# Patient Record
Sex: Female | Born: 1980 | Hispanic: No | Marital: Single | Smoking: Former smoker
Health system: Southern US, Community
[De-identification: ages and names within clinical notes are randomized; demographics above are authoritative.]

## PROBLEM LIST (undated history)

## (undated) ENCOUNTER — Inpatient Hospital Stay (HOSPITAL_COMMUNITY): Payer: Medicaid Other

## (undated) ENCOUNTER — Ambulatory Visit (HOSPITAL_COMMUNITY): Discharge status: Absent without leave | Payer: Medicaid Other

## (undated) DIAGNOSIS — G8929 Other chronic pain: Secondary | ICD-10-CM

## (undated) DIAGNOSIS — M542 Cervicalgia: Secondary | ICD-10-CM

## (undated) DIAGNOSIS — F419 Anxiety disorder, unspecified: Secondary | ICD-10-CM

## (undated) DIAGNOSIS — M5020 Other cervical disc displacement, unspecified cervical region: Secondary | ICD-10-CM

## (undated) DIAGNOSIS — E785 Hyperlipidemia, unspecified: Secondary | ICD-10-CM

## (undated) HISTORY — PX: CERVICAL DISC SURGERY: SHX588

## (undated) HISTORY — PX: NOSE SURGERY: SHX723

---

## 2015-01-02 ENCOUNTER — Encounter (HOSPITAL_COMMUNITY): Payer: Self-pay | Admitting: *Deleted

## 2015-01-02 ENCOUNTER — Emergency Department (HOSPITAL_COMMUNITY)
Admission: EM | Admit: 2015-01-02 | Discharge: 2015-01-03 | Disposition: A | Discharge status: Home / Self Care | Payer: BLUE CROSS/BLUE SHIELD | Attending: Emergency Medicine | Admitting: Emergency Medicine

## 2015-01-02 DIAGNOSIS — H9202 Otalgia, left ear: Secondary | ICD-10-CM | POA: Diagnosis not present

## 2015-01-02 DIAGNOSIS — Z8639 Personal history of other endocrine, nutritional and metabolic disease: Secondary | ICD-10-CM | POA: Diagnosis not present

## 2015-01-02 DIAGNOSIS — G510 Bell's palsy: Secondary | ICD-10-CM | POA: Insufficient documentation

## 2015-01-02 DIAGNOSIS — Z720 Tobacco use: Secondary | ICD-10-CM | POA: Insufficient documentation

## 2015-01-02 DIAGNOSIS — R51 Headache: Secondary | ICD-10-CM | POA: Diagnosis present

## 2015-01-02 HISTORY — DX: Hyperlipidemia, unspecified: E78.5

## 2015-01-02 NOTE — ED Provider Notes (Signed)
CSN: [id number]     Arrival date & time 01/02/15  2313 History  By signing my name below, I, Doreatha Martin, attest that this documentation has been prepared under the direction and in the presence of Shon Baton, MD. Electronically Signed: Doreatha Martin, ED Scribe. 01/03/2015. 12:00 AM.      Chief Complaint  Patient presents with  . Headache   The history is provided by the patient. No language interpreter was used.    HPI Comments: Brandi Lopez is a 34 y.o. female who presents to the Emergency Department complaining of moderate, unchanged left sided facial numbness and weakness onset this morning with associated gradually worsening HA onset after the numbness, left-sided otalgia.  Headache is left-sided and 10 out of 10. She's not taking any of her headache. Denies maximal headache at onset. Denies any neck stiffness or fevers. No hx of HA. No Hx of similar symptoms. No recent illnesses. Pt is not concerned for pregnancy. She denies fever, vomiting, nausea, photophobia, focal weakness, visual disturbance.   Denies recent fevers.   Past Medical History  Diagnosis Date  . Hyperlipemia    History reviewed. No pertinent past surgical history. No family history on file. Social History  Substance Use Topics  . Smoking status: Current Every Day Smoker  . Smokeless tobacco: None  . Alcohol Use: No   OB History    No data available     Review of Systems  Constitutional: Negative for fever.  HENT: Positive for ear pain.   Eyes: Negative for photophobia and visual disturbance.  Cardiovascular: Negative for chest pain.  Gastrointestinal: Negative for nausea and vomiting.  Neurological: Positive for numbness and headaches. Negative for weakness.  All other systems reviewed and are negative.  Allergies  Review of patient's allergies indicates no known allergies.  Home Medications   Prior to Admission medications   Medication Sig Start Date End Date Taking? Authorizing Provider   cyclobenzaprine (FLEXERIL) 10 MG tablet Take 10 mg by mouth 3 (three) times daily as needed for muscle spasms.   Yes Historical Provider, MD  polyvinyl alcohol-povidone (REFRESH) 1.4-0.6 % ophthalmic solution Place 1-2 drops into both eyes as needed. 01/03/15   Shon Baton, MD  predniSONE (DELTASONE) 20 MG tablet Take 3 tablets (60 mg total) by mouth daily with breakfast. 01/03/15   Shon Baton, MD  valACYclovir (VALTREX) 1000 MG tablet Take 1 tablet (1,000 mg total) by mouth 3 (three) times daily. 01/03/15 01/17/15  Shon Baton, MD   BP 109/66 mmHg  Pulse 72  Temp(Src) 98.4 F (36.9 C) (Oral)  Resp 16  Ht  (1.626 m)  Wt 178 lb 8 oz (80.967 kg)  BMI 30.62 kg/m2  SpO2 99%  LMP 11/29/2014 Physical Exam  Constitutional: She is oriented to person, place, and time. She appears well-developed and well-nourished. No distress.  HENT:  Head: Normocephalic and atraumatic.  Mouth/Throat: Oropharynx is clear and moist.  Eyes: Pupils are equal, round, and reactive to light.  Cardiovascular: Normal rate, regular rhythm and normal heart sounds.   Pulmonary/Chest: Effort normal and breath sounds normal. No respiratory distress. She has no wheezes.  Abdominal: Soft. Bowel sounds are normal.  Neurological: She is alert and oriented to person, place, and time.  5 out of 5 strength in all 4 extremities, palsy of the left facial nerve noted with drooping of the entire left face involving the forehead, no other obvious neurologic deficits  Skin: Skin is warm and dry.  Psychiatric:  She has a normal mood and affect.  Nursing note and vitals reviewed.   ED Course  Procedures (including critical care time) DIAGNOSTIC STUDIES: Oxygen Saturation is 98% on RA, normal by my interpretation.    COORDINATION OF CARE: 11:59 PM Discussed treatment plan with pt at bedside and pt agreed to plan.   Labs Review Labs Reviewed - No data to display  Imaging Review No results found. I have  personally reviewed and evaluated these images and lab results as part of my medical decision-making.   EKG Interpretation None      MDM   Final diagnoses:  Bell's palsy    Patient presents with left-sided facial droop, ear pain, numbness, and headache. Symptoms are classic for Bell's palsy. No other neurologic deficits. Patient was given eyedrops, prednisone, and Toradol for her headache. On recheck, patient reports improvement of her headache. Discussed with patient supportive treatment with eyedrops as well as prednisone and valacyclovir for one week. Follow-up with primary physician in one week. Patient stated understanding.  After history, exam, and medical workup I feel the patient has been appropriately medically screened and is safe for discharge home. Pertinent diagnoses were discussed with the patient. Patient was given return precautions.  I personally performed the services described in this documentation, which was scribed in my presence. The recorded information has been reviewed and is accurate.   Shon Baton, MD 01/03/15 [phone]

## 2015-01-02 NOTE — ED Notes (Signed)
The pt woke up this am at 0700am with the lt side of her face numb and her lt eye would not close  Also the lt side of hermlouth her smile  Was not moving on that side.  Headache on the lt.  No previous history.Marland Kitchen lmp aug 20th

## 2015-01-03 ENCOUNTER — Encounter (HOSPITAL_COMMUNITY): Payer: Self-pay

## 2015-01-03 MED ORDER — PREDNISONE 20 MG PO TABS
60.0000 mg | ORAL_TABLET | Freq: Once | ORAL | Status: AC
  Administered 2015-01-03: 60 mg via ORAL
  Filled 2015-01-03: qty 3

## 2015-01-03 MED ORDER — VALACYCLOVIR HCL 1 G PO TABS: 1000.0000 mg | ORAL_TABLET | Freq: Three times a day (TID) | ORAL | Status: AC

## 2015-01-03 MED ORDER — PREDNISONE 20 MG PO TABS: 60.0000 mg | ORAL_TABLET | Freq: Every day | ORAL | Status: DC

## 2015-01-03 MED ORDER — KETOTIFEN FUMARATE 0.025 % OP SOLN
1.0000 [drp] | Freq: Two times a day (BID) | OPHTHALMIC | Status: DC
  Administered 2015-01-03: 1 [drp] via OPHTHALMIC
  Filled 2015-01-03: qty 5

## 2015-01-03 MED ORDER — KETOROLAC TROMETHAMINE 30 MG/ML IJ SOLN
30.0000 mg | Freq: Once | INTRAMUSCULAR | Status: AC
  Administered 2015-01-03: 30 mg via INTRAVENOUS
  Filled 2015-01-03: qty 1

## 2015-01-03 MED ORDER — POLYVINYL ALCOHOL-POVIDONE 1.4-0.6 % OP SOLN: 1.0000 [drp] | OPHTHALMIC | Status: DC | PRN

## 2015-01-03 NOTE — Discharge Instructions (Signed)
Bell's Palsy °Bell's palsy is a condition in which the muscles on one side of the face cannot move (paralysis). This is because the nerves in the face are paralyzed. It is most often thought to be caused by a virus. The virus causes swelling of the nerve that controls movement on one side of the face. The nerve travels through a tight space surrounded by bone. When the nerve swells, it can be compressed by the bone. This results in damage to the protective covering around the nerve. This damage interferes with how the nerve communicates with the muscles of the face. As a result, it can cause weakness or paralysis of the facial muscles.  °Injury (trauma), tumor, and surgery may cause Bell's palsy, but most of the time the cause is unknown. It is a relatively common condition. It starts suddenly (abrupt onset) with the paralysis usually ending within 2 days. Bell's palsy is not dangerous. But because the eye does not close properly, you may need care to keep the eye from getting dry. This can include splinting (to keep the eye shut) or moistening with artificial tears. Bell's palsy very seldom occurs on both sides of the face at the same time. °SYMPTOMS  °· Eyebrow sagging. °· Drooping of the eyelid and corner of the mouth. °· Inability to close one eye. °· Loss of taste on the front of the tongue. °· Sensitivity to loud noises. °TREATMENT  °The treatment is usually non-surgical. If the patient is seen within the first 24 to 48 hours, a short course of steroids may be prescribed, in an attempt to shorten the length of the condition. Antiviral medicines may also be used with the steroids, but it is unclear if they are helpful.  °You will need to protect your eye, if you cannot close it. The cornea (clear covering over your eye) will become dry and can be damaged. Artificial tears can be used to keep your eye moist. Glasses or an eye patch should be worn to protect your eye. °PROGNOSIS  °Recovery is variable, ranging  from days to months. Although the problem usually goes away completely (about 80% of cases resolve), predicting the outcome is impossible. Most people improve within 3 weeks of when the symptoms began. Improvement may continue for 3 to 6 months. A small number of people have moderate to severe weakness that is permanent.  °HOME CARE INSTRUCTIONS  °· If your caregiver prescribed medication to reduce swelling in the nerve, use as directed. Do not stop taking the medication unless directed by your caregiver. °· Use moisturizing eye drops as needed to prevent drying of your eye, as directed by your caregiver. °· Protect your eye, as directed by your caregiver. °· Use facial massage and exercises, as directed by your caregiver. °· Perform your normal activities, and get your normal rest. °SEEK IMMEDIATE MEDICAL CARE IF:  °· There is pain, redness or irritation in the eye. °· You or your child has an oral temperature above 102° F (38.9° C), not controlled by medicine. °MAKE SURE YOU:  °· Understand these instructions. °· Will watch your condition. °· Will get help right away if you are not doing well or get worse. °Document Released: 03/23/2005 Document Revised: 06/15/2011 Document Reviewed: 06/30/2013 °[employer]® Patient Information ©2015 [employer]. This information is not intended to replace advice given to you by your health care provider. Make sure you discuss any questions you have with your health care provider. ° °

## 2016-05-20 ENCOUNTER — Emergency Department (HOSPITAL_BASED_OUTPATIENT_CLINIC_OR_DEPARTMENT_OTHER)
Admission: EM | Admit: 2016-05-20 | Discharge: 2016-05-20 | Disposition: A | Discharge status: Home / Self Care | Payer: Medicaid Other | Attending: Emergency Medicine | Admitting: Emergency Medicine

## 2016-05-20 ENCOUNTER — Encounter (HOSPITAL_BASED_OUTPATIENT_CLINIC_OR_DEPARTMENT_OTHER): Payer: Self-pay

## 2016-05-20 DIAGNOSIS — F17200 Nicotine dependence, unspecified, uncomplicated: Secondary | ICD-10-CM | POA: Diagnosis not present

## 2016-05-20 DIAGNOSIS — Z79899 Other long term (current) drug therapy: Secondary | ICD-10-CM | POA: Insufficient documentation

## 2016-05-20 DIAGNOSIS — M542 Cervicalgia: Secondary | ICD-10-CM | POA: Diagnosis present

## 2016-05-20 DIAGNOSIS — Z8739 Personal history of other diseases of the musculoskeletal system and connective tissue: Secondary | ICD-10-CM | POA: Diagnosis not present

## 2016-05-20 HISTORY — DX: Other cervical disc displacement, unspecified cervical region: M50.20

## 2016-05-20 HISTORY — DX: Cervicalgia: M54.2

## 2016-05-20 HISTORY — DX: Other chronic pain: G89.29

## 2016-05-20 MED ORDER — GABAPENTIN 100 MG PO CAPS
100.0000 mg | ORAL_CAPSULE | Freq: Once | ORAL | Status: AC
  Administered 2016-05-20: 100 mg via ORAL
  Filled 2016-05-20: qty 1

## 2016-05-20 MED ORDER — GABAPENTIN 100 MG PO CAPS: 100.0000 mg | ORAL_CAPSULE | Freq: Three times a day (TID) | ORAL | 2 refills | Status: DC

## 2016-05-20 MED ORDER — CARISOPRODOL 250 MG PO TABS: 250.0000 mg | ORAL_TABLET | Freq: Three times a day (TID) | ORAL | 0 refills | Status: DC

## 2016-05-20 MED ORDER — PREDNISONE 10 MG (21) PO TBPK: ORAL_TABLET | ORAL | 0 refills | Status: DC

## 2016-05-20 MED ORDER — CARISOPRODOL 350 MG PO TABS
350.0000 mg | ORAL_TABLET | Freq: Once | ORAL | Status: DC
  Filled 2016-05-20: qty 1

## 2016-05-20 MED ORDER — PREDNISONE 50 MG PO TABS
60.0000 mg | ORAL_TABLET | Freq: Once | ORAL | Status: AC
  Administered 2016-05-20: 60 mg via ORAL
  Filled 2016-05-20: qty 1

## 2016-05-20 MED FILL — tiZANidine HCL 4 MG TABS: 4 | 14 days supply | Qty: 28 | Fill #0

## 2016-05-20 MED FILL — predniSONE 10 MG TABS: 10 | 12 days supply | Qty: 42 | Fill #0

## 2016-05-20 MED FILL — GABAPENTIN 100 MG CAPSULE: 100 | 30 days supply | Qty: 90 | Fill #0

## 2016-05-20 NOTE — ED Notes (Signed)
Pt states she is out of lyrica

## 2016-05-20 NOTE — ED Provider Notes (Signed)
[facility]-EMERGENCY DEPT Provider Note   CSN: [id number] Arrival date & time: 05/20/16  1349     History   Chief Complaint Chief Complaint  Patient presents with  . Neck Pain    HPI Brandi Lopez is a 36 y.o. female.  HPI   Brandi Lopez is a 36 y.o. female, with a history of herniated cervical disks, presenting to the ED with Acute on chronic neck and right arm pain. Patient states that she has multiple known herniated disks in her cervical spine. She has been able to maintain with her daily prescribed exercises and other nonmedication treatment regimens, however, about a week ago her pain began to become intolerable again. She endorses pain in the right side of her neck that radiates down the right arm into her right hand accompanied by paresthesias. She states that she recently moved here from [place] and has not yet secured a neurologist in the area. Prior to leaving [place], she was changed from Neurontin to Lyrica when she changed neurologists. She states that she believes she will need to change back to Neurontin as the Lyrica is not as effective. Patient denies falls/trauma, unilateral weakness, numbness, fever, or any other complaints.     Past Medical History:  Diagnosis Date  . Herniated cervical disc   . Hyperlipemia   . Neck pain, chronic     There are no active problems to display for this patient.   Past Surgical History:  Procedure Laterality Date  . CERVICAL DISC SURGERY      OB History    No data available       Home Medications    Prior to Admission medications   Medication Sig Start Date End Date Taking? Authorizing Provider  Pregabalin (LYRICA PO) Take by mouth.   Yes Historical Provider, MD  TIZANIDINE HCL PO Take by mouth.   Yes Historical Provider, MD  TRAMADOL HCL ER PO Take by mouth.   Yes Historical Provider, MD  gabapentin (NEURONTIN) 100 MG capsule Take 1 capsule (100 mg total) by mouth 3 (three) times daily. 05/20/16 08/18/16  Nusrat Encarnacion C Nandan Willems, PA-C    polyvinyl alcohol-povidone (REFRESH) 1.4-0.6 % ophthalmic solution Place 1-2 drops into both eyes as needed. 01/03/15   Shon Baton, MD  predniSONE (STERAPRED UNI-PAK 21 TAB) 10 MG (21) TBPK tablet Take 6 tabs by mouth daily  for 2 days, then 5 tabs for 2 days, then 4 tabs for 2 days, then 3 tabs for 2 days, 2 tabs for 2 days, then 1 tab by mouth daily for 2 days 05/20/16   Anselm Pancoast, PA-C    Family History No family history on file.  Social History Social History  Substance Use Topics  . Smoking status: Current Every Day Smoker  . Smokeless tobacco: Never Used  . Alcohol use No     Allergies   Patient has no known allergies.   Review of Systems Review of Systems  Constitutional: Negative for fever.  Musculoskeletal: Positive for neck pain.  Neurological: Negative for dizziness, syncope, weakness, light-headedness, numbness and headaches.     Physical Exam Updated Vital Signs BP 129/80   Pulse 87   Temp 98.1 F (36.7 C)   Resp 16   LMP 05/10/2016   SpO2 100%   Physical Exam  Constitutional: She appears well-developed and well-nourished. No distress.  HENT:  Head: Normocephalic and atraumatic.  Eyes: Conjunctivae and EOM are normal. Pupils are equal, round, and reactive to light.  Neck: Normal range  of motion. Neck supple.  Cardiovascular: Normal rate, regular rhythm and intact distal pulses.   Pulmonary/Chest: Effort normal.  Musculoskeletal: She exhibits no edema.  Tenderness to the posterior cervical region extending into the right trapezius. Normal motor function intact in all extremities and spine. No midline spinal tenderness.   Lymphadenopathy:    She has no cervical adenopathy.  Neurological: She is alert.  No sensory deficits. Strength 5/5 in all extremities. No gait disturbance. Coordination intact including heel to shin and finger to nose. Cranial nerves III-XII grossly intact. No facial droop.   Skin: Skin is warm and dry. Capillary refill takes  less than 2 seconds. She is not diaphoretic.  Psychiatric: She has a normal mood and affect. Her behavior is normal.  Nursing note and vitals reviewed.    ED Treatments / Results  Labs (all labs ordered are listed, but only abnormal results are displayed) Labs Reviewed - No data to display  EKG  EKG Interpretation None       Radiology No results found.  Procedures Procedures (including critical care time)  Medications Ordered in ED Medications  gabapentin (NEURONTIN) capsule 100 mg (100 mg Oral Given 05/20/16 1520)  predniSONE (DELTASONE) tablet 60 mg (60 mg Oral Given 05/20/16 1520)     Initial Impression / Assessment and Plan / ED Course  I have reviewed the triage vital signs and the nursing notes.  Pertinent labs & imaging results that were available during my care of the patient were reviewed by me and considered in my medical decision making (see chart for details).     Patient presents with acute on chronic neck pain. Shared decision making was used to determine the best medication regimen for her at this time. She was encouraged to follow-up with a neurologist and to establish care with a PCP. Resources were discussed. Return precautions were also discussed. Patient voices understanding and is comfortable with discharge.    The pharmacy called me and told me that Tresa GarterSoma is not covered without prior authorization under Medicaid. Tresa GarterSoma was canceled with the pharmacy and replaced with tizanidine 4 mg twice a day for 2 weeks. Patient has the option to split these in half. This was verified with the patient as an acceptable alternative.    Vitals:   05/20/16 1359 05/20/16 1543  BP: 129/80 125/91  Pulse: 87 84  Resp: 16 16  Temp: 98.1 F (36.7 C)   SpO2: 100% 100%      Final Clinical Impressions(s) / ED Diagnoses   Final diagnoses:  Neck pain    New Prescriptions Discharge Medication List as of 05/20/2016  3:38 PM    START taking these medications    Details  gabapentin (NEURONTIN) 100 MG capsule Take 1 capsule (100 mg total) by mouth 3 (three) times daily., Starting Wed 05/20/2016, Until Tue 08/18/2016, Print    predniSONE (STERAPRED UNI-PAK 21 TAB) 10 MG (21) TBPK tablet Take 6 tabs by mouth daily  for 2 days, then 5 tabs for 2 days, then 4 tabs for 2 days, then 3 tabs for 2 days, 2 tabs for 2 days, then 1 tab by mouth daily for 2 days, Print    carisoprodol (SOMA) 250 MG tablet Take 1 tablet (250 mg total) by mouth 3 (three) times daily., Starting Wed 05/20/2016, Until Wed 06/03/2016, Print         Anselm PancoastShawn C Maude Hettich, PA-C 05/21/16 [id number]    Loren Raceravid Yelverton, MD 05/21/16 [phone]

## 2016-05-20 NOTE — Discharge Instructions (Signed)
You have been seen today for neck pain that you are familiar with. Please establish care with a neurologist in the area as soon as possible.  Please refer to the dedicated section in this discharge summary for a phone number and website that can give you a starting point in finding a primary care provider. You may also try contacting [facility] [phone]

## 2016-05-20 NOTE — ED Triage Notes (Signed)
Pt reports hx of "hernaited disk" to neck-neck pain x 1 week-denies injury-NAD-steady gait

## 2016-05-28 ENCOUNTER — Emergency Department (HOSPITAL_BASED_OUTPATIENT_CLINIC_OR_DEPARTMENT_OTHER)
Admission: EM | Admit: 2016-05-28 | Discharge: 2016-05-28 | Disposition: A | Discharge status: Home / Self Care | Payer: Medicaid Other | Attending: Emergency Medicine | Admitting: Emergency Medicine

## 2016-05-28 ENCOUNTER — Emergency Department (HOSPITAL_BASED_OUTPATIENT_CLINIC_OR_DEPARTMENT_OTHER): Payer: Medicaid Other

## 2016-05-28 ENCOUNTER — Encounter (HOSPITAL_BASED_OUTPATIENT_CLINIC_OR_DEPARTMENT_OTHER): Payer: Self-pay | Admitting: *Deleted

## 2016-05-28 DIAGNOSIS — Z79899 Other long term (current) drug therapy: Secondary | ICD-10-CM | POA: Insufficient documentation

## 2016-05-28 DIAGNOSIS — R200 Anesthesia of skin: Secondary | ICD-10-CM | POA: Diagnosis not present

## 2016-05-28 DIAGNOSIS — F17200 Nicotine dependence, unspecified, uncomplicated: Secondary | ICD-10-CM | POA: Insufficient documentation

## 2016-05-28 DIAGNOSIS — M542 Cervicalgia: Secondary | ICD-10-CM | POA: Diagnosis present

## 2016-05-28 MED ORDER — MELOXICAM 15 MG PO TABS: 15.0000 mg | ORAL_TABLET | Freq: Every day | ORAL | 0 refills | Status: DC

## 2016-05-28 MED ORDER — OXYCODONE-ACETAMINOPHEN 5-325 MG PO TABS: 1.0000 | ORAL_TABLET | ORAL | 0 refills | Status: DC | PRN

## 2016-05-28 MED FILL — OXYCODONE/APAP 5/325 MG TAB: 5-325 | 2 days supply | Qty: 20 | Fill #0

## 2016-05-28 MED FILL — MELOXICAM 15 MG TABLET: 15 | 30 days supply | Qty: 30 | Fill #0

## 2016-05-28 NOTE — Discharge Instructions (Signed)
Your CT scan was negative. Your bells palsy may be recurring. Please follow up with your MRI today and Dr. Ellan Lambertnasanya. Return for any new or abnormal neurological symptoms.

## 2016-05-28 NOTE — ED Triage Notes (Signed)
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 She saw a neurologist yesterday. He scheduled an MRI for today. States she is having worsened coldness in her right arm and twitching and numbness in her face for a few days. The neurologist told her until he saw the results of the MRI there was not much he could do.

## 2016-05-28 NOTE — ED Provider Notes (Signed)
[facility]   CSN: [id number] Arrival date & time: 05/28/16  1045     History   Chief Complaint Chief Complaint  Patient presents with  . Neck Pain    HPI Brandi Lopez is a 36 y.o. 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 Previous self limiting episodes. This episode is worst and longest seen  On 05/20/2016 in the ed and given prednisone, Neurontin, zanaflex. She felt that the Neurontin made her really sleepy and weak. She has not been taking it as prescribed. She saw Dr. Ellan Lambert at [facility] neurology who has her scheudled for an MRI Today at 4:30. 2 days ago dhe began having R facial numbness. Arm is very weak, affecting her ability to type at work. Arm feels "like its freezing cold and my muscle feel so weak I can barely move it."  Family Hx of Machado-Joseph disease (an inherited neurologic ataxia disorder).  She has a history of previous right-sided Bell's palsy  HPI  Past Medical History:  Diagnosis Date  . Herniated cervical disc   . Hyperlipemia   . Neck pain, chronic     There are no active problems to display for this patient.   Past Surgical History:  Procedure Laterality Date  . CERVICAL DISC SURGERY      OB History    No data available       Home Medications    Prior to Admission medications   Medication Sig Start Date End Date Taking? Authorizing Provider  gabapentin (NEURONTIN) 100 MG capsule Take 1 capsule (100 mg total) by mouth 3 (three) times daily. 05/20/16 08/18/16  Shawn C Joy, PA-C  meloxicam (MOBIC) 15 MG tablet Take 1 tablet (15 mg total) by mouth daily. Take 1 daily with food. 05/28/16   Arthor Captain, PA-C  oxyCODONE-acetaminophen (PERCOCET) 5-325 MG tablet Take 1-2 tablets by mouth every 4 (four) hours as needed. 05/28/16   Arthor Captain, PA-C  polyvinyl alcohol-povidone (REFRESH) 1.4-0.6 % ophthalmic solution Place 1-2 drops into both eyes as needed. 01/03/15   Shon Baton, MD    predniSONE (STERAPRED UNI-PAK 21 TAB) 10 MG (21) TBPK tablet Take 6 tabs by mouth daily  for 2 days, then 5 tabs for 2 days, then 4 tabs for 2 days, then 3 tabs for 2 days, 2 tabs for 2 days, then 1 tab by mouth daily for 2 days 05/20/16   Shawn C Joy, PA-C  Pregabalin (LYRICA PO) Take by mouth.    Historical Provider, MD  TIZANIDINE HCL PO Take by mouth.    Historical Provider, MD  TRAMADOL HCL ER PO Take by mouth.    Historical Provider, MD    Family History No family history on file.  Social History Social History  Substance Use Topics  . Smoking status: Current Every Day Smoker  . Smokeless tobacco: Never Used  . Alcohol use No     Allergies   Patient has no known allergies.   Review of Systems Review of Systems  Ten systems reviewed and are negative for acute change, except as noted in the HPI.   Physical Exam Updated Vital Signs BP 130/73 (BP Location: Right Arm)   Pulse 74   Temp 98.3 F (36.8 C) (Oral)   Resp 16   Ht 5\' 4"  (1.626 m)   Wt 78 kg   LMP 05/10/2016   SpO2 100%   BMI 29.52 kg/m   Physical Exam  Constitutional: She is oriented  to person, place, and time. She appears well-developed and well-nourished. No distress.  HENT:  Head: Normocephalic and atraumatic.  Eyes: Conjunctivae and EOM are normal. Pupils are equal, round, and reactive to light. No scleral icterus.  Neck: Normal range of motion.  Cardiovascular: Normal rate, regular rhythm and normal heart sounds.  Exam reveals no gallop and no friction rub.   No murmur heard. Pulmonary/Chest: Effort normal and breath sounds normal. No respiratory distress.  Abdominal: Soft. Bowel sounds are normal. She exhibits no distension and no mass. There is no tenderness. There is no guarding.  Neurological: She is alert and oriented to person, place, and time.  Speech is clear and goal oriented, follows commands Major Cranial nerves without deficit EXCEPT SUBJECTIVE FACIAL NUMBNESS ON THE RIGHT, no facial  droop Normal strength in upper and lower extremities bilaterally including dorsiflexion and plantar flexion, strong and equal grip strength Sensation normal to light and sharp touch Moves extremities without ataxia, coordination intact Normal finger to nose and rapid alternating movements Neg romberg, no pronator drift Normal gait Normal heel-shin and balance   Skin: Skin is warm and dry. She is not diaphoretic.     ED Treatments / Results  Labs (all labs ordered are listed, but only abnormal results are displayed) Labs Reviewed - No data to display  EKG  EKG Interpretation None       Radiology Ct Head Wo Contrast  Result Date: 05/28/2016 CLINICAL DATA:  Right facial numbness EXAM: CT HEAD WITHOUT CONTRAST TECHNIQUE: Contiguous axial images were obtained from the base of the skull through the vertex without intravenous contrast. COMPARISON:  None. FINDINGS: Brain: No evidence of acute infarction, hemorrhage, hydrocephalus, extra-axial collection or mass lesion/mass effect. Vascular: No hyperdense vessel or unexpected calcification. Skull: Negative Sinuses/Orbits: Negative Other: None IMPRESSION: Negative CT head Electronically Signed   By: Marlan Palau M.D.   On: 05/28/2016 13:28    Procedures Procedures (including critical care time)  Medications Ordered in ED Medications - No data to display   Initial Impression / Assessment and Plan / ED Course  I have reviewed the triage vital signs and the nursing notes.  Pertinent labs & imaging results that were available during my care of the patient were reviewed by me and considered in my medical decision making (see chart for details).  Clinical Course as of May 28 1653  Thu May 28, 2016  1232 REVIEWED nccsrs- NO MEDS   [AH]    Clinical Course User Index [AH] Arthor Captain, PA-C    Patient with negative CT of the head. I suspect that her facial sensation is likely secondary to potential recurring Bell's palsy or due to  her shoulder pain and referred to the face. I did discuss this with Dr. Anitra Lauth. She does not feel that we need to work. The patient not for stroke at this time. Patient states that she also discussed this with her neurologist yesterday and he did not order or recommend an MRI of the brain at that time. She has a pending MRI of the neck this afternoon and I have recommended that she make sure she goes. I have also given the patient's anti-inflammatory and pain medications for treatment of her severe symptom. He is safe for discharge at this time Final Clinical Impressions(s) / ED Diagnoses   Final diagnoses:  Neck pain    New Prescriptions Discharge Medication List as of 05/28/2016  1:59 PM    START taking these medications   Details  meloxicam (  MOBIC) 15 MG tablet Take 1 tablet (15 mg total) by mouth daily. Take 1 daily with food., Starting Thu 05/28/2016, Print    oxyCODONE-acetaminophen (PERCOCET) 5-325 MG tablet Take 1-2 tablets by mouth every 4 (four) hours as needed., Starting Thu 05/28/2016, Print         Arthor CaptainAbigail Cydnie Deason, PA-C 05/28/16 1655    Gwyneth SproutWhitney Plunkett, MD 05/29/16 [phone]

## 2016-05-28 NOTE — ED Notes (Signed)
Patient transported to CT 

## 2016-06-15 MED FILL — GABAPENTIN 100 MG CAP: 100 | 30 days supply | Qty: 90 | Fill #1

## 2016-07-01 ENCOUNTER — Other Ambulatory Visit (HOSPITAL_COMMUNITY)
Admission: RE | Admit: 2016-07-01 | Discharge: 2016-07-01 | Disposition: A | Discharge status: Home / Self Care | Payer: Medicaid Other | Source: Ambulatory Visit | Attending: Obstetrics | Admitting: Obstetrics

## 2016-07-01 ENCOUNTER — Ambulatory Visit (INDEPENDENT_AMBULATORY_CARE_PROVIDER_SITE_OTHER): Payer: Medicaid Other | Admitting: Obstetrics

## 2016-07-01 ENCOUNTER — Encounter: Payer: Self-pay | Admitting: Obstetrics

## 2016-07-01 VITALS — BP 127/86 | HR 86 | Ht 64.0 in | Wt 170.0 lb

## 2016-07-01 DIAGNOSIS — Z113 Encounter for screening for infections with a predominantly sexual mode of transmission: Secondary | ICD-10-CM

## 2016-07-01 DIAGNOSIS — N898 Other specified noninflammatory disorders of vagina: Secondary | ICD-10-CM

## 2016-07-01 DIAGNOSIS — Z01419 Encounter for gynecological examination (general) (routine) without abnormal findings: Secondary | ICD-10-CM

## 2016-07-01 DIAGNOSIS — Z20828 Contact with and (suspected) exposure to other viral communicable diseases: Secondary | ICD-10-CM

## 2016-07-01 NOTE — Progress Notes (Signed)
Subjective:        Brandi Lopez is a 36 y.o. female here for a routine exam.  Current complaints: Concerned that partner has HSV-1.    Personal health questionnaire:  Is patient Brandi Lopez, have a family history of breast and/or ovarian cancer: no Is there a family history of uterine cancer diagnosed at age < 44, gastrointestinal cancer, urinary tract cancer, family member who is a Personnel officer syndrome-associated carrier: no Is the patient overweight and hypertensive, family history of diabetes, personal history of gestational diabetes, preeclampsia or PCOS: no Is patient over 28, have PCOS,  family history of premature CHD under age 23, diabetes, smoke, have hypertension or peripheral artery disease:  no At any time, has a partner hit, kicked or otherwise hurt or frightened you?: no Over the past 2 weeks, have you felt down, depressed or hopeless?: no Over the past 2 weeks, have you felt little interest or pleasure in doing things?:no   Gynecologic History Patient's last menstrual period was 06/16/2016. Contraception: none Last Pap: 2016. Results were: normal Last mammogram: few years ago. Results were: normal  Obstetric History OB History  Gravida Para Term Preterm AB Living  2       1 1   SAB TAB Ectopic Multiple Live Births  1            # Outcome Date GA Lbr Len/2nd Weight Sex Delivery Anes PTL Lv  2 Gravida           1 SAB               Past Medical History:  Diagnosis Date  . Herniated cervical disc   . Hyperlipemia   . Neck pain, chronic     Past Surgical History:  Procedure Laterality Date  . CERVICAL DISC SURGERY       Current Outpatient Prescriptions:  .  gabapentin (NEURONTIN) 100 MG capsule, Take 1 capsule (100 mg total) by mouth 3 (three) times daily., Disp: 90 capsule, Rfl: 2 .  meloxicam (MOBIC) 15 MG tablet, Take 1 tablet (15 mg total) by mouth daily. Take 1 daily with food., Disp: 30 tablet, Rfl: 0 .  oxyCODONE-acetaminophen (PERCOCET) 5-325 MG  tablet, Take 1-2 tablets by mouth every 4 (four) hours as needed., Disp: 20 tablet, Rfl: 0 .  TIZANIDINE HCL PO, Take by mouth., Disp: , Rfl:  .  polyvinyl alcohol-povidone (REFRESH) 1.4-0.6 % ophthalmic solution, Place 1-2 drops into both eyes as needed. (Patient not taking: Reported on 07/01/2016), Disp: 30 mL, Rfl: 0 .  predniSONE (STERAPRED UNI-PAK 21 TAB) 10 MG (21) TBPK tablet, Take 6 tabs by mouth daily  for 2 days, then 5 tabs for 2 days, then 4 tabs for 2 days, then 3 tabs for 2 days, 2 tabs for 2 days, then 1 tab by mouth daily for 2 days (Patient not taking: Reported on 07/01/2016), Disp: 42 tablet, Rfl: 0 .  Pregabalin (LYRICA PO), Take by mouth., Disp: , Rfl:  .  TRAMADOL HCL ER PO, Take by mouth., Disp: , Rfl:  No Known Allergies  Social History  Substance Use Topics  . Smoking status: Current Every Day Smoker    Types: Cigarettes  . Smokeless tobacco: Never Used  . Alcohol use No    Family History  Problem Relation Age of Onset  . Diabetes Mother   . Hypertension Mother   . Diabetes Father   . Hypertension Father       Review of Systems  Constitutional: negative  for fatigue and weight loss Respiratory: negative for cough and wheezing Cardiovascular: negative for chest pain, fatigue and palpitations Gastrointestinal: negative for abdominal pain and change in bowel habits Musculoskeletal:negative for myalgias Neurological: negative for gait problems and tremors Behavioral/Psych: negative for abusive relationship, depression Endocrine: negative for temperature intolerance    Genitourinary:negative for abnormal menstrual periods, genital lesions, hot flashes, sexual problems and vaginal discharge Integument/breast: negative for breast lump, breast tenderness, nipple discharge and skin lesion(s)    Objective:       BP 127/86   Pulse 86   Ht 5\' 4"  (1.626 m)   Wt 170 lb (77.1 kg)   LMP 06/16/2016   BMI 29.18 kg/m  General:   alert  Skin:   no rash or  abnormalities  Lungs:   clear to auscultation bilaterally  Heart:   regular rate and rhythm, S1, S2 normal, no murmur, click, rub or gallop  Breasts:   normal without suspicious masses, skin or nipple changes or axillary nodes  Abdomen:  normal findings: no organomegaly, soft, non-tender and no hernia  Pelvis:  External genitalia: normal general appearance Urinary system: urethral meatus normal and bladder without fullness, nontender Vaginal: normal without tenderness, induration or masses Cervix: normal appearance Adnexa: normal bimanual exam Uterus: anteverted and non-tender, normal size   Lab Review Urine pregnancy test Labs reviewed yes Radiologic studies reviewed yes  50% of 20 min visit spent on counseling and coordination of care.    Assessment:    Healthy female exam.    Plan:    Education reviewed: calcium supplements, depression evaluation, low fat, low cholesterol diet, safe sex/STD prevention, self breast exams, smoking cessation and weight bearing exercise. Contraception: none. Follow up in: 1 year.    Orders Placed This Encounter  Procedures  . Hepatitis C antibody  . Hepatitis B surface antigen  . HSV(herpes smplx)abs-1+2(IgG+IgM)-bld  . RPR  . HIV antibody      Patient ID: Brandi Lopez, female   DOB: 02/10/1981, 36 y.o.   MRN: [id number]

## 2016-07-01 NOTE — Progress Notes (Signed)
Pt presents for annual, pap, and all STD testing. Pt's partner was recently diagnosed with HSV 1 and wants to be tested. Last pap normal 2 yrs ago per pt. Pt has hx painful lumpy breast and had to get mgm's in the past; all benign. Denies hx breast cancer.

## 2016-07-02 LAB — CERVICOVAGINAL ANCILLARY ONLY
Bacterial vaginitis: NEGATIVE
CHLAMYDIA, DNA PROBE: NEGATIVE
Candida vaginitis: NEGATIVE
NEISSERIA GONORRHEA: NEGATIVE
TRICH (WINDOWPATH): NEGATIVE

## 2016-07-03 LAB — CYTOLOGY - PAP
DIAGNOSIS: NEGATIVE
HPV (WINDOPATH): NOT DETECTED

## 2016-07-04 LAB — HIV ANTIBODY (ROUTINE TESTING W REFLEX): HIV SCREEN 4TH GENERATION: NONREACTIVE

## 2016-07-04 LAB — HEPATITIS B SURFACE ANTIGEN: HEP B S AG: NEGATIVE

## 2016-07-04 LAB — HSV(HERPES SMPLX)ABS-I+II(IGG+IGM)-BLD
HSV 2 Glycoprotein G Ab, IgG: <0.91 index (ref 0.00–0.90)
HSVI/II COMB AB IGM: 1.07 ratio — AB (ref 0.00–0.90)

## 2016-07-04 LAB — RPR: RPR Ser Ql: NONREACTIVE

## 2016-07-04 LAB — HEPATITIS C ANTIBODY: Hep C Virus Ab: <0.1 s/co ratio (ref 0.0–0.9)

## 2016-08-31 ENCOUNTER — Emergency Department (HOSPITAL_BASED_OUTPATIENT_CLINIC_OR_DEPARTMENT_OTHER)
Admission: EM | Admit: 2016-08-31 | Discharge: 2016-08-31 | Disposition: A | Discharge status: Home / Self Care | Payer: Medicaid Other | Attending: Emergency Medicine | Admitting: Emergency Medicine

## 2016-08-31 ENCOUNTER — Emergency Department (HOSPITAL_BASED_OUTPATIENT_CLINIC_OR_DEPARTMENT_OTHER): Payer: Medicaid Other

## 2016-08-31 ENCOUNTER — Encounter (HOSPITAL_BASED_OUTPATIENT_CLINIC_OR_DEPARTMENT_OTHER): Payer: Self-pay | Admitting: *Deleted

## 2016-08-31 DIAGNOSIS — K029 Dental caries, unspecified: Secondary | ICD-10-CM | POA: Insufficient documentation

## 2016-08-31 DIAGNOSIS — Z79899 Other long term (current) drug therapy: Secondary | ICD-10-CM | POA: Insufficient documentation

## 2016-08-31 DIAGNOSIS — J069 Acute upper respiratory infection, unspecified: Secondary | ICD-10-CM | POA: Diagnosis not present

## 2016-08-31 DIAGNOSIS — F17210 Nicotine dependence, cigarettes, uncomplicated: Secondary | ICD-10-CM | POA: Insufficient documentation

## 2016-08-31 DIAGNOSIS — K0889 Other specified disorders of teeth and supporting structures: Secondary | ICD-10-CM

## 2016-08-31 DIAGNOSIS — R05 Cough: Secondary | ICD-10-CM | POA: Diagnosis present

## 2016-08-31 MED ORDER — FLUTICASONE PROPIONATE 50 MCG/ACT NA SUSP: 2.0000 | Freq: Every day | NASAL | 0 refills | Status: DC

## 2016-08-31 MED ORDER — IBUPROFEN 600 MG PO TABS: 600.0000 mg | ORAL_TABLET | Freq: Four times a day (QID) | ORAL | 0 refills | Status: DC | PRN

## 2016-08-31 MED ORDER — GUAIFENESIN-DM 100-10 MG/5ML PO SYRP: 5.0000 mL | ORAL_SOLUTION | ORAL | 0 refills | Status: DC | PRN

## 2016-08-31 MED ORDER — PENICILLIN V POTASSIUM 500 MG PO TABS: 500.0000 mg | ORAL_TABLET | Freq: Four times a day (QID) | ORAL | 0 refills | Status: AC

## 2016-08-31 NOTE — ED Provider Notes (Signed)
[facility]   CSN: [id number] Arrival date & time: 08/31/16  1042     History   Chief Complaint Chief Complaint  Patient presents with  . Dental Pain    HPI  Brandi Lopez is a 36 y.o. female presents today with chief complaint cough and nasal congestion for 5 days as well as left-sided dental pain for 3-4 days. She states that she developed cough that is intermittently productive of green sputum 5 days ago as well as chest congestion, sinus pressure and pain and sore throat 5 days ago. She endorses chest tightness which is intermittent but no chest pain and shortness of breath due to her cough. She has tried TheraFlu, Mucinex day and night, and a self imposed prednisone taper with left over prednisone that she had from treatment of her chronic neck pain. She states she took 40 mg of Prednisone one day followed by 30 mg the next. She states none of this has been significantly helpful.  She also endorses development of left sided facial pain to 4 days ago. She states pain initially began in the left lower jaw and now radiates to the ear and left temple. She endorses sensitivity to hot and cold foods, facial swelling. She states pain is throbbing and at times sharp. She has tried oil, ibuprofen, ice, heat which is not been helpful. She denies any drainage or bleeding. She states that one year ago she had a cavity filled in the same tooth that is causing pain today.  Denies fevers, abd pain, n/v/d.  The history is provided by the patient.    Past Medical History:  Diagnosis Date  . Herniated cervical disc   . Hyperlipemia   . Neck pain, chronic     There are no active problems to display for this patient.   Past Surgical History:  Procedure Laterality Date  . CERVICAL DISC SURGERY      OB History    Gravida Para Term Preterm AB Living   2       1 1    SAB TAB Ectopic Multiple Live Births   1               Home Medications    Prior to Admission  medications   Medication Sig Start Date End Date Taking? Authorizing Provider  fluticasone (FLONASE) 50 MCG/ACT nasal spray Place 2 sprays into both nostrils daily. 08/31/16   Avarae Zwart A, PA-C  gabapentin (NEURONTIN) 100 MG capsule Take 1 capsule (100 mg total) by mouth 3 (three) times daily. 05/20/16 08/18/16  Joy, Shawn C, PA-C  guaiFENesin-dextromethorphan (ROBITUSSIN DM) 100-10 MG/5ML syrup Take 5 mLs by mouth every 4 (four) hours as needed for cough. 08/31/16   Kanisha Duba A, PA-C  ibuprofen (ADVIL,MOTRIN) 600 MG tablet Take 1 tablet (600 mg total) by mouth every 6 (six) hours as needed. 08/31/16   Luevenia MaxinFawze, Camay Pedigo A, PA-C  meloxicam (MOBIC) 15 MG tablet Take 1 tablet (15 mg total) by mouth daily. Take 1 daily with food. 05/28/16   Arthor CaptainHarris, Abigail, PA-C  penicillin v potassium (VEETID) 500 MG tablet Take 1 tablet (500 mg total) by mouth 4 (four) times daily. 08/31/16 09/07/16  Michela PitcherFawze, Adhira Jamil A, PA-C  polyvinyl alcohol-povidone (REFRESH) 1.4-0.6 % ophthalmic solution Place 1-2 drops into both eyes as needed. Patient not taking: Reported on 07/01/2016 01/03/15   Horton, Mayer Maskerourtney F, MD  Pregabalin (LYRICA PO) Take by mouth.    [provider]  TIZANIDINE HCL PO Take  by mouth.    [provider]  TRAMADOL HCL ER PO Take by mouth.    [provider]    Family History Family History  Problem Relation Age of Onset  . Diabetes Mother   . Hypertension Mother   . Diabetes Father   . Hypertension Father     Social History Social History  Substance Use Topics  . Smoking status: Current Every Day Smoker    Types: Cigarettes  . Smokeless tobacco: Never Used  . Alcohol use No     Allergies   Patient has no known allergies.   Review of Systems Review of Systems  Constitutional: Positive for chills. Negative for fever.  HENT: Positive for congestion, dental problem, sinus pain and sinus pressure. Negative for trouble swallowing.   Respiratory: Positive for cough, chest  tightness and shortness of breath.   Cardiovascular: Negative for chest pain.  Gastrointestinal: Negative for abdominal pain, diarrhea, nausea and vomiting.  Neurological: Negative for headaches.  All other systems reviewed and are negative.    Physical Exam Updated Vital Signs BP 130/75 (BP Location: Right Arm)   Pulse 74   Temp 98.2 F (36.8 C) (Oral)   Resp 18   Ht 5\' 4"  (1.626 m)   Wt 77.1 kg (170 lb)   LMP 08/08/2016   SpO2 99%   BMI 29.18 kg/m   Physical Exam  Constitutional: She appears well-developed and well-nourished.  HENT:  Head: Normocephalic and atraumatic.  Right Ear: External ear normal.  Left Ear: External ear normal.  Left maxillary sinus tenderness palpation, no frontal sinus TTP. TMs with serous fluid behind them, no erythema or bulging. Nasal septum is midline with pink mucosa and yellow-green drainage. Posterior oropharynx with postnasal drip, no tonsillar hypertrophy, exudates, uvular deviation, or erythema. Mild dental decay noted throughout mouth. There is a silver on the left lower first molar, which is tender to palpation. Gingiva appear pink and healthy. No swelling or fluctuance of the buccal mucosa, no bleeding or drainage noted. No significant facial swelling, no sublingual abnormalities or tenderness, no trismus. Left-sided face is tender to palpation. No deformity or crepitus noted.  Eyes: Conjunctivae and EOM are normal. Pupils are equal, round, and reactive to light. Right eye exhibits no discharge. Left eye exhibits no discharge. No scleral icterus.  Neck: Normal range of motion. Neck supple. No JVD present. No tracheal deviation present.  Cardiovascular: Normal rate, regular rhythm, normal heart sounds and intact distal pulses.   Pulmonary/Chest: Effort normal and breath sounds normal. She has no wheezes.  Abdominal: She exhibits no distension.  Musculoskeletal: She exhibits no edema.  Lymphadenopathy:    She has no cervical adenopathy.    Neurological: She is alert. No sensory deficit.  Skin: Skin is warm and dry. Capillary refill takes less than 2 seconds.  Psychiatric: She has a normal mood and affect. Her behavior is normal.     ED Treatments / Results  Labs (all labs ordered are listed, but only abnormal results are displayed) Labs Reviewed - No data to display  EKG  EKG Interpretation None       Radiology Dg Chest 2 View  Result Date: 08/31/2016 CLINICAL DATA:  Cough, congestion, sinus pain EXAM: CHEST  2 VIEW COMPARISON:  None. FINDINGS: Lungs are clear.  No pleural effusion or pneumothorax. The heart is normal in size. Visualized osseous structures are within normal limits. IMPRESSION: Normal chest radiographs. Electronically Signed   By: Charline Bills M.D.   On: 08/31/2016  12:42    Procedures Procedures (including critical care time)  Medications Ordered in ED Medications - No data to display   Initial Impression / Assessment and Plan / ED Course  I have reviewed the triage vital signs and the nursing notes.  Pertinent labs & imaging results that were available during my care of the patient were reviewed by me and considered in my medical decision making (see chart for details).     Patient with toothache.  No gross abscess.  Exam unconcerning for Ludwig's angina or spread of infection.  Will treat with penicillin and ibuprofen, tylenol, ice, and heat. Urged patient to follow-up with dentist. Patient also with cough and congestion. Pt CXR negative for acute infiltrate. Low suspicion of pneumonia, bronchitis. Patients symptoms are consistent with URI or possible maxillary sinusitis, likely viral etiology. Discussed that antibiotics are not indicated for viral infections. Pt will be discharged with symptomatic treatment.  Verbalizes understanding and is agreeable with plan. Pt is hemodynamically stable & in NAD prior to dc. Final Clinical Impressions(s) / ED Diagnoses   Final diagnoses:  Pain,  dental  URI with cough and congestion    New Prescriptions New Prescriptions   FLUTICASONE (FLONASE) 50 MCG/ACT NASAL SPRAY    Place 2 sprays into both nostrils daily.   GUAIFENESIN-DEXTROMETHORPHAN (ROBITUSSIN DM) 100-10 MG/5ML SYRUP    Take 5 mLs by mouth every 4 (four) hours as needed for cough.   IBUPROFEN (ADVIL,MOTRIN) 600 MG TABLET    Take 1 tablet (600 mg total) by mouth every 6 (six) hours as needed.   PENICILLIN V POTASSIUM (VEETID) 500 MG TABLET    Take 1 tablet (500 mg total) by mouth 4 (four) times daily.     Jeanie Sewer, PA-C 08/31/16 1314    Benjiman Core, MD 08/31/16 [phone]

## 2016-08-31 NOTE — ED Triage Notes (Signed)
Pt reports 5 days of cough, congestion and sinus pain, now pain to left ear and throat.

## 2016-08-31 NOTE — Discharge Instructions (Signed)
Please take all of your antibiotics until finished!   You may develop abdominal discomfort or diarrhea from the antibiotic.  You may help offset this with probiotics which you can buy or get in yogurt. Do not eat  or take the probiotics until 2 hours after your antibiotic.   Alternating ibuprofen and Tylenol every 3 hours for pain for the next few days. Apply ice packs or heat packs to the left side of your face comfort. Follow-up with your dentist for reevaluation of your dental pain, or referred to the attached resource guide for other dentists in the area. May take Flonase and cough syrup for your congestion and cough. His your symptoms persist greater than 2 weeks, follow-up with primary care for reevaluation and possible treatment of a bacterial sinusitis. Return to the ED if any concerning symptoms develop.

## 2016-10-29 ENCOUNTER — Telehealth: Payer: Self-pay

## 2016-10-29 NOTE — Telephone Encounter (Signed)
Returned call, left vm.

## 2017-01-12 ENCOUNTER — Emergency Department (HOSPITAL_COMMUNITY)
Admission: EM | Admit: 2017-01-12 | Discharge: 2017-01-12 | Disposition: A | Discharge status: Home / Self Care | Payer: Medicaid Other | Attending: Emergency Medicine | Admitting: Emergency Medicine

## 2017-01-12 ENCOUNTER — Emergency Department (HOSPITAL_COMMUNITY): Payer: Medicaid Other

## 2017-01-12 ENCOUNTER — Encounter (HOSPITAL_COMMUNITY): Payer: Self-pay | Admitting: Emergency Medicine

## 2017-01-12 DIAGNOSIS — R0789 Other chest pain: Secondary | ICD-10-CM | POA: Diagnosis not present

## 2017-01-12 DIAGNOSIS — F17210 Nicotine dependence, cigarettes, uncomplicated: Secondary | ICD-10-CM | POA: Diagnosis not present

## 2017-01-12 DIAGNOSIS — R079 Chest pain, unspecified: Secondary | ICD-10-CM | POA: Diagnosis present

## 2017-01-12 DIAGNOSIS — F419 Anxiety disorder, unspecified: Secondary | ICD-10-CM | POA: Insufficient documentation

## 2017-01-12 DIAGNOSIS — Z79899 Other long term (current) drug therapy: Secondary | ICD-10-CM | POA: Diagnosis not present

## 2017-01-12 HISTORY — DX: Anxiety disorder, unspecified: F41.9

## 2017-01-12 LAB — CBC WITH DIFFERENTIAL/PLATELET
BASOS ABS: 0 10*3/uL (ref 0.0–0.1)
Basophils Relative: 0 %
EOS PCT: 0 %
Eosinophils Absolute: 0 10*3/uL (ref 0.0–0.7)
HCT: 41 % (ref 36.0–46.0)
Hemoglobin: 13.9 g/dL (ref 12.0–15.0)
Lymphocytes Relative: 29 %
Lymphs Abs: 5.1 10*3/uL — ABNORMAL HIGH (ref 0.7–4.0)
MCH: 29.1 pg (ref 26.0–34.0)
MCHC: 33.9 g/dL (ref 30.0–36.0)
MCV: 85.8 fL (ref 78.0–100.0)
MONO ABS: 1.2 10*3/uL — AB (ref 0.1–1.0)
Monocytes Relative: 7 %
NEUTROS PCT: 64 %
Neutro Abs: 11.3 10*3/uL — ABNORMAL HIGH (ref 1.7–7.7)
PLATELETS: 359 10*3/uL (ref 150–400)
RBC: 4.78 MIL/uL (ref 3.87–5.11)
RDW: 13 % (ref 11.5–15.5)
WBC: 17.6 10*3/uL — AB (ref 4.0–10.5)

## 2017-01-12 LAB — D-DIMER, QUANTITATIVE: D-Dimer, Quant: <0.27 ug/mL-FEU (ref 0.00–0.50)

## 2017-01-12 LAB — BASIC METABOLIC PANEL
Anion gap: 13 (ref 5–15)
BUN: 15 mg/dL (ref 6–20)
CHLORIDE: 102 mmol/L (ref 101–111)
CO2: 21 mmol/L — ABNORMAL LOW (ref 22–32)
CREATININE: 0.62 mg/dL (ref 0.44–1.00)
Calcium: 9.5 mg/dL (ref 8.9–10.3)
GFR calc non Af Amer: >60 mL/min (ref >60)
Glucose, Bld: 74 mg/dL (ref 65–99)
POTASSIUM: 3.8 mmol/L (ref 3.5–5.1)
SODIUM: 136 mmol/L (ref 135–145)

## 2017-01-12 LAB — I-STAT TROPONIN, ED: TROPONIN I, POC: 0.01 ng/mL (ref 0.00–0.08)

## 2017-01-12 LAB — POC URINE PREG, ED: PREG TEST UR: NEGATIVE

## 2017-01-12 MED ORDER — LORAZEPAM 2 MG/ML IJ SOLN
0.5000 mg | Freq: Once | INTRAMUSCULAR | Status: AC
  Administered 2017-01-12: 0.5 mg via INTRAVENOUS
  Filled 2017-01-12: qty 1

## 2017-01-12 MED ORDER — GI COCKTAIL ~~LOC~~
30.0000 mL | Freq: Once | ORAL | Status: AC
  Administered 2017-01-12: 30 mL via ORAL
  Filled 2017-01-12: qty 30

## 2017-01-12 MED ORDER — LORAZEPAM 0.5 MG PO TABS: 0.5000 mg | ORAL_TABLET | Freq: Three times a day (TID) | ORAL | 0 refills | Status: DC | PRN

## 2017-01-12 MED ORDER — KETOROLAC TROMETHAMINE 30 MG/ML IJ SOLN
15.0000 mg | Freq: Once | INTRAMUSCULAR | Status: AC
  Administered 2017-01-12: 15 mg via INTRAVENOUS
  Filled 2017-01-12: qty 1

## 2017-01-12 MED ORDER — LORAZEPAM 1 MG PO TABS: 1.0000 mg | ORAL_TABLET | Freq: Once | ORAL | Status: DC

## 2017-01-12 NOTE — ED Provider Notes (Signed)
[facility]-EMERGENCY DEPT Provider Note   CSN: [id number] Arrival date & time: 01/12/17  1331     History   Chief Complaint Chief Complaint  Patient presents with  . Chest Pain  . Anxiety    HPI Brandi Lopez is a 36 y.o. female.  HPI 36 year old Caucasian female past medical history significant for anxiety and hyperlipidemia presents to the emergency department today with complaints of chest pain and anxiety. The patient states that she was recently diagnosed with anxiety. She was started on Paxil and hydroxyzine. The patient states that on Wednesday she had a panic attack. States that since then she is on a hard time "catching her breath". States that it feels like "her chest is caving in". She also reports intermittent dizzy spells denies any at this time or recent. Denies any syncope. The patient feels like this is related to her anxiety attack. She isn't taking her Paxil and hydroxyzine with little relief. Nothing makes better worse. The chest pain is not exertional. It is substernal and does not radiate. Not associated with diaphoresis, nausea, emesis. Does report some intermittent shortness of breath and pleuritic chest pain. Denies any cardiac history. Does report history of hyperlipidemia which is controlled with diet. Denies any diagnosis of high blood pressure diabetes. The patient denies any OCP use, prolonged immobilization or recent hospitalizations or surgeries, history of DVT/PE, unilateral leg swelling or calf tenderness.  The patient states that her anxiety is triggered by stress. She is currently under a lot of stress at this time.  Pt denies any fever, chill, ha, vision changes, lightheadedness, congestion, neck pain, cough, abd pain, n/v/d, urinary symptoms, change in bowel habits, melena, hematochezia, lower extremity paresthesias.  Past Medical History:  Diagnosis Date  . Anxiety   . Herniated cervical disc   . Hyperlipemia   . Neck pain, chronic     There are no  active problems to display for this patient.   Past Surgical History:  Procedure Laterality Date  . CERVICAL DISC SURGERY      OB History    Gravida Para Term Preterm AB Living   SAB TAB Ectopic Multiple Live Births   1               Home Medications    Prior to Admission medications   Medication Sig Start Date End Date Taking? Authorizing Provider  fluticasone (FLONASE) 50 MCG/ACT nasal spray Place 2 sprays into both nostrils daily. 08/31/16   Fawze, Mina A, PA-C  gabapentin (NEURONTIN) 100 MG capsule Take 1 capsule (100 mg total) by mouth 3 (three) times daily. 05/20/16 08/18/16  Joy, Shawn C, PA-C  guaiFENesin-dextromethorphan (ROBITUSSIN DM) 100-10 MG/5ML syrup Take 5 mLs by mouth every 4 (four) hours as needed for cough. 08/31/16   Fawze, Mina A, PA-C  ibuprofen (ADVIL,MOTRIN) 600 MG tablet Take 1 tablet (600 mg total) by mouth every 6 (six) hours as needed. 08/31/16   Luevenia Maxin, Mina A, PA-C  meloxicam (MOBIC) 15 MG tablet Take 1 tablet (15 mg total) by mouth daily. Take 1 daily with food. 05/28/16   Arthor Captain, PA-C  polyvinyl alcohol-povidone (REFRESH) 1.4-0.6 % ophthalmic solution Place 1-2 drops into both eyes as needed. Patient not taking: Reported on 07/01/2016 01/03/15   Horton, Mayer Masker, MD  Pregabalin (LYRICA PO) Take by mouth.    [provider]  TIZANIDINE HCL PO Take by mouth.    [provider]  TRAMADOL  HCL ER PO Take by mouth.    [provider]    Family History Family History  Problem Relation Age of Onset  . Diabetes Mother   . Hypertension Mother   . Diabetes Father   . Hypertension Father     Social History Social History  Substance Use Topics  . Smoking status: Current Every Day Smoker    Types: Cigarettes  . Smokeless tobacco: Never Used  . Alcohol use No     Allergies   Patient has no known allergies.   Review of Systems Review of Systems  Constitutional: Negative for chills, diaphoresis and  fever.  HENT: Negative for congestion.   Eyes: Negative for visual disturbance.  Respiratory: Positive for shortness of breath. Negative for cough.   Cardiovascular: Positive for chest pain. Negative for palpitations and leg swelling.  Gastrointestinal: Negative for abdominal pain, diarrhea, nausea and vomiting.  Genitourinary: Negative for dysuria, flank pain, frequency, hematuria and urgency.  Musculoskeletal: Negative for arthralgias and myalgias.  Skin: Negative for rash.  Neurological: Negative for dizziness, syncope, weakness, light-headedness, numbness and headaches.  Psychiatric/Behavioral: Negative for sleep disturbance. The patient is nervous/anxious.      Physical Exam Updated Vital Signs BP 109/79 (BP Location: Left Arm)   Pulse 74   Temp 98.2 F (36.8 C) (Oral)   Resp 16   Ht  (1.626 m)   Wt 78 kg (172 lb)   LMP 12/24/2016   SpO2 100%   BMI 29.52 kg/m   Physical Exam  Constitutional: She is oriented to person, place, and time. She appears well-developed and well-nourished.  Non-toxic appearance. No distress.  HENT:  Head: Normocephalic and atraumatic.  Nose: Nose normal.  Mouth/Throat: Oropharynx is clear and moist.  Eyes: Pupils are equal, round, and reactive to light. Conjunctivae and EOM are normal. Right eye exhibits no discharge. Left eye exhibits no discharge.  Neck: Normal range of motion. Neck supple. No JVD present. No tracheal deviation present.  Cardiovascular: Normal rate, regular rhythm, normal heart sounds and intact distal pulses.  Exam reveals no gallop and no friction rub.   No murmur heard. Pulmonary/Chest: Effort normal and breath sounds normal. No respiratory distress. She has no wheezes. She has no rales. She exhibits tenderness (across the precordium).  No hypoxia or tachypnea.  Abdominal: Soft. Bowel sounds are normal. She exhibits no distension. There is no tenderness. There is no rebound and no guarding.  Musculoskeletal: Normal  range of motion.  No lower extremity edema or calf tenderness.  Lymphadenopathy:    She has no cervical adenopathy.  Neurological: She is alert and oriented to person, place, and time.  Skin: Skin is warm and dry. Capillary refill takes less than 2 seconds. She is not diaphoretic.  Psychiatric: Her speech is normal. Judgment and thought content normal. Her mood appears anxious. She is agitated.  Pt tearful in the room.   Nursing note and vitals reviewed.    ED Treatments / Results  Labs (all labs ordered are listed, but only abnormal results are displayed) Labs Reviewed  BASIC METABOLIC PANEL - Abnormal; Notable for the following:       Result Value   CO2 21 (*)    All other components within normal limits  CBC WITH DIFFERENTIAL/PLATELET - Abnormal; Notable for the following:    WBC 17.6 (*)    Neutro Abs 11.3 (*)    Lymphs Abs 5.1 (*)    Monocytes Absolute 1.2 (*)    All other components  within normal limits  D-DIMER, QUANTITATIVE (NOT AT [facility])  POC URINE PREG, ED  I-STAT TROPONIN, ED    EKG  EKG Interpretation None       Radiology Dg Chest 2 View  Result Date: 01/12/2017 CLINICAL DATA:  Chest pain, shortness of breath, and dizziness. EXAM: CHEST  2 VIEW COMPARISON:  08/31/2016 FINDINGS: The cardiomediastinal silhouette is within normal limits. Foreign body projecting just superior to the aortic knob is presumably external to the patient, possibly associated with a necklace. The lungs are less well inflated than on the prior study. No confluent airspace opacity, edema, pleural effusion, or pneumothorax is identified. No acute osseous abnormality is seen. IMPRESSION: No active cardiopulmonary disease. Electronically Signed   By: Sebastian Ache M.D.   On: 01/12/2017 16:00    Procedures Procedures (including critical care time)  Medications Ordered in ED Medications  gi cocktail (Maalox,Lidocaine,Donnatal) (30 mLs Oral Given 01/12/17 1749)  ketorolac (TORADOL) 30 MG/ML  injection 15 mg (15 mg Intravenous Given 01/12/17 1749)  LORazepam (ATIVAN) injection 0.5 mg (0.5 mg Intravenous Given 01/12/17 1747)     Initial Impression / Assessment and Plan / ED Course  I have reviewed the triage vital signs and the nursing notes.  Pertinent labs & imaging results that were available during my care of the patient were reviewed by me and considered in my medical decision making (see chart for details).     Pt presents to the Ed today with complaints of cp. Patient is to be discharged with recommendation to follow up with PCP in regards to today's hospital visit.   Chest pain is not likely of cardiac or pulmonary etiology d/t presentation, d-dimer negative, VSS, no tracheal deviation, no JVD or new murmur, RRR, breath sounds equal bilaterally, EKG shows no signs of ischemia, negative troponin, and negative CXR. Heart pathway score is 1. Cp ongoing for 3 days. Very atypical for ACS and do not feel that pt needs delta trop.   Pt presentation is not consistent with pe, acs, dissection pe. Pt feels much improved after tordol, gi cocktail and ativan. Feel pt symptoms are related to anxiety. Will give short course of ativan until follow up with pcp next week.  The patient does have a leukocytosis of 17,000. Lungs clear to auscultation bilaterally. Chest x-ray unremarkable. Denies any other infectious symptoms. Likely nonspecific. May be due to patient's anxiety and stress response. Close follow up with  PCP for recheck.  Pt has been advised to return to the ED is CP becomes exertional, associated with diaphoresis or nausea, radiates to left jaw/arm, worsens or becomes concerning in any way.   Pt is hemodynamically stable, in NAD, & able to ambulate in the ED. Evaluation does not show pathology that would require ongoing emergent intervention or inpatient treatment. I explained the diagnosis to the patient. Pain has been managed & has no complaints prior to dc. Pt is comfortable  with above plan and is stable for discharge at this time. All questions were answered prior to disposition. Strict return precautions for f/u to the ED were discussed. Encouraged follow up with PCP.      Final Clinical Impressions(s) / ED Diagnoses   Final diagnoses:  Atypical chest pain  Anxiety    New Prescriptions New Prescriptions   LORAZEPAM (ATIVAN) 0.5 MG TABLET    Take 1 tablet (0.5 mg total) by mouth 3 (three) times daily as needed for anxiety.     Rise Mu, PA-C 01/12/17 1905  Benjiman Core, MD 01/12/17 [phone]

## 2017-01-12 NOTE — ED Triage Notes (Signed)
Pt states she has been having dizzy spells, feels like she can't catch her breath, chest tightness. Pt has anxiety attacks. Pt states she had a panic attack on Wednesday. Pt speaking in complete sentences. Pt feels it's mostly anxiety related.

## 2017-01-12 NOTE — Discharge Instructions (Signed)
Your workup has been reassuring. I feel that your symptoms may be due to urinary anxiety. We'll give you a small course of Ativan to take for your panic attacks and to follow up with the primary care doctor. Please return to the ED if he develop any worsening symptoms.

## 2017-08-13 ENCOUNTER — Other Ambulatory Visit: Payer: Self-pay | Admitting: Neurosurgery

## 2017-08-13 DIAGNOSIS — M4722 Other spondylosis with radiculopathy, cervical region: Secondary | ICD-10-CM

## 2017-08-19 ENCOUNTER — Ambulatory Visit
Admission: RE | Admit: 2017-08-19 | Discharge: 2017-08-19 | Disposition: A | Discharge status: Home / Self Care | Payer: 59 | Source: Ambulatory Visit | Attending: Neurosurgery | Admitting: Neurosurgery

## 2017-08-19 DIAGNOSIS — M4722 Other spondylosis with radiculopathy, cervical region: Secondary | ICD-10-CM

## 2018-01-03 ENCOUNTER — Other Ambulatory Visit: Payer: Self-pay

## 2018-01-03 ENCOUNTER — Encounter (HOSPITAL_BASED_OUTPATIENT_CLINIC_OR_DEPARTMENT_OTHER): Payer: Self-pay | Admitting: Emergency Medicine

## 2018-01-03 ENCOUNTER — Emergency Department (HOSPITAL_BASED_OUTPATIENT_CLINIC_OR_DEPARTMENT_OTHER)
Admission: EM | Admit: 2018-01-03 | Discharge: 2018-01-03 | Disposition: A | Discharge status: Home / Self Care | Payer: Medicaid Other | Attending: Emergency Medicine | Admitting: Emergency Medicine

## 2018-01-03 DIAGNOSIS — R51 Headache: Secondary | ICD-10-CM | POA: Diagnosis not present

## 2018-01-03 DIAGNOSIS — F17210 Nicotine dependence, cigarettes, uncomplicated: Secondary | ICD-10-CM | POA: Insufficient documentation

## 2018-01-03 DIAGNOSIS — R519 Headache, unspecified: Secondary | ICD-10-CM

## 2018-01-03 DIAGNOSIS — E785 Hyperlipidemia, unspecified: Secondary | ICD-10-CM | POA: Diagnosis not present

## 2018-01-03 DIAGNOSIS — R110 Nausea: Secondary | ICD-10-CM

## 2018-01-03 DIAGNOSIS — Z79899 Other long term (current) drug therapy: Secondary | ICD-10-CM | POA: Insufficient documentation

## 2018-01-03 DIAGNOSIS — R112 Nausea with vomiting, unspecified: Secondary | ICD-10-CM | POA: Insufficient documentation

## 2018-01-03 LAB — URINALYSIS, ROUTINE W REFLEX MICROSCOPIC
Bilirubin Urine: NEGATIVE
Glucose, UA: NEGATIVE mg/dL
Hgb urine dipstick: NEGATIVE
Ketones, ur: NEGATIVE mg/dL
Leukocytes, UA: NEGATIVE
Nitrite: NEGATIVE
Protein, ur: NEGATIVE mg/dL
Specific Gravity, Urine: 1.025 (ref 1.005–1.030)
pH: 6.5 (ref 5.0–8.0)

## 2018-01-03 LAB — COMPREHENSIVE METABOLIC PANEL
ALT: 15 U/L (ref 0–44)
AST: 18 U/L (ref 15–41)
Albumin: 3.8 g/dL (ref 3.5–5.0)
Alkaline Phosphatase: 37 U/L — ABNORMAL LOW (ref 38–126)
Anion gap: 8 (ref 5–15)
BUN: 12 mg/dL (ref 6–20)
CO2: 24 mmol/L (ref 22–32)
Calcium: 8.8 mg/dL — ABNORMAL LOW (ref 8.9–10.3)
Chloride: 107 mmol/L (ref 98–111)
Creatinine, Ser: 0.7 mg/dL (ref 0.44–1.00)
GFR calc Af Amer: >60 mL/min (ref >60)
GFR calc non Af Amer: >60 mL/min (ref >60)
Glucose, Bld: 94 mg/dL (ref 70–99)
Potassium: 3.5 mmol/L (ref 3.5–5.1)
Sodium: 139 mmol/L (ref 135–145)
Total Bilirubin: 0.3 mg/dL (ref 0.3–1.2)
Total Protein: 6.9 g/dL (ref 6.5–8.1)

## 2018-01-03 LAB — CBC
HCT: 38.1 % (ref 36.0–46.0)
Hemoglobin: 12.9 g/dL (ref 12.0–15.0)
MCH: 29.3 pg (ref 26.0–34.0)
MCHC: 33.9 g/dL (ref 30.0–36.0)
MCV: 86.6 fL (ref 78.0–100.0)
Platelets: 289 10*3/uL (ref 150–400)
RBC: 4.4 MIL/uL (ref 3.87–5.11)
RDW: 13.8 % (ref 11.5–15.5)
WBC: 9.8 10*3/uL (ref 4.0–10.5)

## 2018-01-03 LAB — LIPASE, BLOOD: Lipase: 37 U/L (ref 11–51)

## 2018-01-03 LAB — PREGNANCY, URINE: Preg Test, Ur: NEGATIVE

## 2018-01-03 MED ORDER — ONDANSETRON HCL 4 MG PO TABS: 4.0000 mg | ORAL_TABLET | Freq: Four times a day (QID) | ORAL | 0 refills | Status: DC

## 2018-01-03 MED ORDER — SODIUM CHLORIDE 0.9 % IV BOLUS
1000.0000 mL | Freq: Once | INTRAVENOUS | Status: AC
  Administered 2018-01-03: 1000 mL via INTRAVENOUS

## 2018-01-03 MED ORDER — ONDANSETRON HCL 4 MG/2ML IJ SOLN
4.0000 mg | Freq: Once | INTRAMUSCULAR | Status: AC
  Administered 2018-01-03: 4 mg via INTRAVENOUS
  Filled 2018-01-03: qty 2

## 2018-01-03 MED ORDER — KETOROLAC TROMETHAMINE 30 MG/ML IJ SOLN
30.0000 mg | Freq: Once | INTRAMUSCULAR | Status: AC
  Administered 2018-01-03: 30 mg via INTRAVENOUS
  Filled 2018-01-03: qty 1

## 2018-01-03 NOTE — Discharge Instructions (Signed)
You can take Excedrin or ibuprofen as prescribed over-the-counter, as needed for headache.  Take Zofran every 6 hours as needed for nausea or vomiting.  Please follow-up with your doctor for further evaluation and treatment.  You can follow-up with your thyroid lab that is still pending on MyChart, or your doctor can follow-up.  Please return the emergency department if you develop any new or worsening symptoms.

## 2018-01-03 NOTE — ED Notes (Signed)
ED Provider at bedside. 

## 2018-01-03 NOTE — ED Notes (Signed)
Lab requests redraw for CMP and lipase.

## 2018-01-03 NOTE — ED Triage Notes (Signed)
Pt reports emesis x 2 weeks, headache  X 2 weeks, lethargy. Denies abd nor urinary symptoms. Alert and oriented x 4.

## 2018-01-03 NOTE — ED Notes (Signed)
Pt verbalizes understanding of d/c instructions and denies any further needs at this time. 

## 2018-01-04 LAB — TSH: TSH: 0.795 u[IU]/mL (ref 0.350–4.500)

## 2018-01-04 NOTE — ED Provider Notes (Addendum)
[facility] EMERGENCY DEPARTMENT Provider Note   CSN: [id number] Arrival date & time: 01/03/18  1839     History   Chief Complaint Chief Complaint  Patient presents with  . Emesis    x 2 weeks    HPI Brandi Lopez is a 37 y.o. female with history of anxiety, panic attacks who presents with a 2-week history of headache, nausea, and intermittent vomiting.  Patient has had associated generalized fatigue and weakness.  She denies any recent travel, tick exposure.  She denies any associated fevers, cough, nasal congestion.  Patient reports her nausea is worse in the morning, but improves over the course the day.  She also has noticed a couple times that she has had palpitations when she lies down.  She denies any chest pain, shortness of breath, abdominal pain.  She has taken Excedrin at home for her headaches with some relief.  She describes the headache as a pain on the crown of her head.  She reports having tension headaches in the back of her neck and then occipital area in the past, but not the crown of her head.  Patient also reports that she had a rash to the back of her hands and tops of her feet last week.  It was itchy.  It is now resolved.  She feels like it may have been related to a cat she came into contact with.  HPI  Past Medical History:  Diagnosis Date  . Anxiety   . Herniated cervical disc   . Hyperlipemia   . Neck pain, chronic     There are no active problems to display for this patient.   Past Surgical History:  Procedure Laterality Date  . CERVICAL DISC SURGERY       OB History    Gravida  2   Para      Term      Preterm      AB  1   Living  1     SAB  1   TAB      Ectopic      Multiple      Live Births               Home Medications    Prior to Admission medications   Medication Sig Start Date End Date Taking? Authorizing Provider  fluticasone (FLONASE) 50 MCG/ACT nasal spray Place 2 sprays into both nostrils daily.  08/31/16   Fawze, Mina A, PA-C  gabapentin (NEURONTIN) 100 MG capsule Take 1 capsule (100 mg total) by mouth 3 (three) times daily. 05/20/16 08/18/16  Joy, Shawn C, PA-C  guaiFENesin-dextromethorphan (ROBITUSSIN DM) 100-10 MG/5ML syrup Take 5 mLs by mouth every 4 (four) hours as needed for cough. 08/31/16   Fawze, Mina A, PA-C  ibuprofen (ADVIL,MOTRIN) 600 MG tablet Take 1 tablet (600 mg total) by mouth every 6 (six) hours as needed. 08/31/16   Fawze, Mina A, PA-C  LORazepam (ATIVAN) 0.5 MG tablet Take 1 tablet (0.5 mg total) by mouth 3 (three) times daily as needed for anxiety. 01/12/17   Rise Mu, PA-C  meloxicam (MOBIC) 15 MG tablet Take 1 tablet (15 mg total) by mouth daily. Take 1 daily with food. 05/28/16   Harris, Abigail, PA-C  ondansetron (ZOFRAN) 4 MG tablet Take 1 tablet (4 mg total) by mouth every 6 (six) hours. 01/03/18   Derrica Sieg, Waylan Boga, PA-C  polyvinyl alcohol-povidone (REFRESH) 1.4-0.6 % ophthalmic solution Place 1-2 drops into both eyes as  needed. Patient not taking: Reported on 07/01/2016 01/03/15   Horton, Mayer Masker, MD  Pregabalin (LYRICA PO) Take by mouth.    [provider]  TIZANIDINE HCL PO Take by mouth.    [provider]  TRAMADOL HCL ER PO Take by mouth.    [provider]    Family History Family History  Problem Relation Age of Onset  . Diabetes Mother   . Hypertension Mother   . Diabetes Father   . Hypertension Father     Social History Social History   Tobacco Use  . Smoking status: Current Every Day Smoker    Types: Cigarettes  . Smokeless tobacco: Never Used  Substance Use Topics  . Alcohol use: No  . Drug use: No     Allergies   Patient has no known allergies.   Review of Systems Review of Systems  Constitutional: Positive for fatigue. Negative for chills and fever.  HENT: Negative for facial swelling and sore throat.   Eyes: Negative for visual disturbance.  Respiratory: Negative for shortness of breath.     Cardiovascular: Positive for palpitations. Negative for chest pain.  Gastrointestinal: Positive for nausea and vomiting (very infrequent). Negative for abdominal pain and diarrhea.  Genitourinary: Negative for dysuria.  Musculoskeletal: Negative for back pain and neck pain.  Skin: Positive for rash (resolved). Negative for wound.  Neurological: Positive for weakness (generalized), light-headedness and headaches. Negative for numbness.  Psychiatric/Behavioral: The patient is not nervous/anxious.      Physical Exam Updated Vital Signs BP 112/70 (BP Location: Left Arm)   Pulse 80   Temp 98.9 F (37.2 C) (Oral)   Resp 20   Ht 5\' 4"  (1.626 m)   Wt 78 kg   LMP 11/24/2017   SpO2 100%   BMI 29.52 kg/m   Physical Exam  Constitutional: She appears well-developed and well-nourished. No distress.  HENT:  Head: Normocephalic and atraumatic.  Mouth/Throat: Oropharynx is clear and moist. No oropharyngeal exudate.  Eyes: Pupils are equal, round, and reactive to light. Conjunctivae and EOM are normal. Right eye exhibits no discharge. Left eye exhibits no discharge. No scleral icterus.  Neck: Normal range of motion. Neck supple. No neck rigidity. No thyromegaly present.  Chin to chest without difficulty, no midline tenderness  Cardiovascular: Normal rate, regular rhythm, normal heart sounds and intact distal pulses. Exam reveals no gallop and no friction rub.  No murmur heard. Pulmonary/Chest: Effort normal and breath sounds normal. No stridor. No respiratory distress. She has no wheezes. She has no rales.  Abdominal: Soft. Bowel sounds are normal. She exhibits no distension. There is no tenderness. There is no rebound and no guarding.  Musculoskeletal: She exhibits no edema.  Lymphadenopathy:    She has no cervical adenopathy.  Neurological: She is alert. Coordination normal.  CN 3-12 intact; normal sensation throughout; 5/5 strength in all 4 extremities; equal bilateral grip strength; no  ataxia on finger-to-nose  Skin: Skin is warm and dry. No rash noted. She is not diaphoretic. No pallor.  Psychiatric: She has a normal mood and affect.  Nursing note and vitals reviewed.    ED Treatments / Results  Labs (all labs ordered are listed, but only abnormal results are displayed) Labs Reviewed  COMPREHENSIVE METABOLIC PANEL - Abnormal; Notable for the following components:      Result Value   Calcium 8.8 (*)    Alkaline Phosphatase 37 (*)    All other components within normal limits  URINALYSIS, ROUTINE W REFLEX  MICROSCOPIC  PREGNANCY, URINE  CBC  LIPASE, BLOOD  TSH    EKG EKG Interpretation  Date/Time:  Monday January 03 2018 21:13:10 EDT Ventricular Rate:  76 PR Interval:    QRS Duration: 87 QT Interval:  367 QTC Calculation: 413 R Axis:   68 Text Interpretation:  Sinus rhythm Confirmed by Raeford Razor [phone]) on 01/03/2018 10:35:19 PM   Radiology No results found.  Procedures Procedures (including critical care time)  Medications Ordered in ED Medications  sodium chloride 0.9 % bolus 1,000 mL (0 mLs Intravenous Stopped 01/03/18 2246)  ondansetron (ZOFRAN) injection 4 mg (4 mg Intravenous Given 01/03/18 2114)  ketorolac (TORADOL) 30 MG/ML injection 30 mg (30 mg Intravenous Given 01/03/18 2144)     Initial Impression / Assessment and Plan / ED Course  I have reviewed the triage vital signs and the nursing notes.  Pertinent labs & imaging results that were available during my care of the patient were reviewed by me and considered in my medical decision making (see chart for details).     Patient presenting with multiple symptoms ongoing for the past 2 weeks.  Her vitals are stable.  She is afebrile.  She has no reported history of fever.  Normal neuro exam without focal deficits.  Labs are unremarkable.  TSH is pending.  EKG shows NSR.  Suspect patient may be experiencing migraines, however will refer to PCP for further work-up.  Low suspicion for  tickborne illness, as patient has had no fever.  Rash is resolved.  Patient felt improved after fluids, Toradol, Zofran.  Will discharge home with Zofran for treatment of the nausea.  Strict return precautions given.  Patient understands and agrees with plan.  Patient vitals stable throughout ED course and discharged in satisfactory condition. I discussed patient case with Dr. Juleen China who guided the patient's management and agrees with plan.   Final Clinical Impressions(s) / ED Diagnoses   Final diagnoses:  Bad headache  Nausea    ED Discharge Orders         Ordered    ondansetron (ZOFRAN) 4 MG tablet  Every 6 hours     01/03/18 2232               Emi Holes, PA-C 01/04/18 0011    Raeford Razor, MD 01/12/18 [phone]

## 2018-01-20 ENCOUNTER — Other Ambulatory Visit (HOSPITAL_BASED_OUTPATIENT_CLINIC_OR_DEPARTMENT_OTHER): Payer: Self-pay

## 2018-01-20 DIAGNOSIS — G4700 Insomnia, unspecified: Secondary | ICD-10-CM

## 2018-01-20 DIAGNOSIS — G4730 Sleep apnea, unspecified: Secondary | ICD-10-CM

## 2018-02-16 ENCOUNTER — Encounter (HOSPITAL_BASED_OUTPATIENT_CLINIC_OR_DEPARTMENT_OTHER): Payer: Medicaid Other

## 2018-02-21 ENCOUNTER — Ambulatory Visit (HOSPITAL_BASED_OUTPATIENT_CLINIC_OR_DEPARTMENT_OTHER): Payer: Medicaid Other | Attending: Internal Medicine | Admitting: Internal Medicine

## 2018-02-21 VITALS — Ht 64.0 in | Wt 172.0 lb

## 2018-02-21 DIAGNOSIS — G4700 Insomnia, unspecified: Secondary | ICD-10-CM | POA: Diagnosis present

## 2018-02-21 DIAGNOSIS — R0683 Snoring: Secondary | ICD-10-CM | POA: Insufficient documentation

## 2018-02-21 DIAGNOSIS — G4730 Sleep apnea, unspecified: Secondary | ICD-10-CM | POA: Diagnosis present

## 2018-03-04 ENCOUNTER — Ambulatory Visit (HOSPITAL_COMMUNITY)
Admission: EM | Admit: 2018-03-04 | Discharge: 2018-03-04 | Disposition: A | Discharge status: Home / Self Care | Payer: Medicaid Other | Attending: Internal Medicine | Admitting: Internal Medicine

## 2018-03-04 ENCOUNTER — Encounter (HOSPITAL_COMMUNITY): Payer: Self-pay

## 2018-03-04 DIAGNOSIS — Z9889 Other specified postprocedural states: Secondary | ICD-10-CM | POA: Insufficient documentation

## 2018-03-04 DIAGNOSIS — R05 Cough: Secondary | ICD-10-CM | POA: Diagnosis not present

## 2018-03-04 DIAGNOSIS — F419 Anxiety disorder, unspecified: Secondary | ICD-10-CM | POA: Diagnosis not present

## 2018-03-04 DIAGNOSIS — J029 Acute pharyngitis, unspecified: Secondary | ICD-10-CM | POA: Insufficient documentation

## 2018-03-04 DIAGNOSIS — Z833 Family history of diabetes mellitus: Secondary | ICD-10-CM | POA: Insufficient documentation

## 2018-03-04 DIAGNOSIS — B9789 Other viral agents as the cause of diseases classified elsewhere: Secondary | ICD-10-CM

## 2018-03-04 DIAGNOSIS — Z791 Long term (current) use of non-steroidal anti-inflammatories (NSAID): Secondary | ICD-10-CM | POA: Diagnosis not present

## 2018-03-04 DIAGNOSIS — Z79899 Other long term (current) drug therapy: Secondary | ICD-10-CM | POA: Insufficient documentation

## 2018-03-04 DIAGNOSIS — F17210 Nicotine dependence, cigarettes, uncomplicated: Secondary | ICD-10-CM | POA: Insufficient documentation

## 2018-03-04 DIAGNOSIS — R509 Fever, unspecified: Secondary | ICD-10-CM | POA: Diagnosis present

## 2018-03-04 DIAGNOSIS — J069 Acute upper respiratory infection, unspecified: Secondary | ICD-10-CM | POA: Diagnosis not present

## 2018-03-04 LAB — POCT RAPID STREP A: Streptococcus, Group A Screen (Direct): NEGATIVE

## 2018-03-04 MED ORDER — BENZONATATE 100 MG PO CAPS: 100.0000 mg | ORAL_CAPSULE | Freq: Three times a day (TID) | ORAL | 0 refills | Status: DC

## 2018-03-04 NOTE — ED Provider Notes (Signed)
[facility]    CSN: [id number] Arrival date & time: 03/04/18  1745     History   Chief Complaint Chief Complaint  Patient presents with  . URI    HPI Brandi Lopez is a 37 y.o. female.   Patient with no chronic medical problems presents to urgent care complaining of subjective fever, chills, sore throat and cough.  Patient also reports sinus congestion as well as runny nose x3 days.  Prior to the onset of the symptoms the patient had diarrhea for 1 week.  She denies rash.  There are no sick contacts in the home.     Past Medical History:  Diagnosis Date  . Anxiety   . Herniated cervical disc   . Hyperlipemia   . Neck pain, chronic     There are no active problems to display for this patient.   Past Surgical History:  Procedure Laterality Date  . CERVICAL DISC SURGERY      OB History    Gravida  2   Para      Term      Preterm      AB  1   Living  1     SAB  1   TAB      Ectopic      Multiple      Live Births               Home Medications    Prior to Admission medications   Medication Sig Start Date End Date Taking? Authorizing Provider  benzonatate (TESSALON) 100 MG capsule Take 1 capsule (100 mg total) by mouth every 8 (eight) hours. 03/04/18   Arnaldo Natal, MD  fluticasone (FLONASE) 50 MCG/ACT nasal spray Place 2 sprays into both nostrils daily. 08/31/16   Fawze, Mina A, PA-C  gabapentin (NEURONTIN) 100 MG capsule Take 1 capsule (100 mg total) by mouth 3 (three) times daily. 05/20/16 08/18/16  Joy, Shawn C, PA-C  guaiFENesin-dextromethorphan (ROBITUSSIN DM) 100-10 MG/5ML syrup Take 5 mLs by mouth every 4 (four) hours as needed for cough. 08/31/16   Fawze, Mina A, PA-C  ibuprofen (ADVIL,MOTRIN) 600 MG tablet Take 1 tablet (600 mg total) by mouth every 6 (six) hours as needed. 08/31/16   Fawze, Mina A, PA-C  LORazepam (ATIVAN) 0.5 MG tablet Take 1 tablet (0.5 mg total) by mouth 3 (three) times daily as needed for anxiety.  01/12/17   Rise Mu, PA-C  meloxicam (MOBIC) 15 MG tablet Take 1 tablet (15 mg total) by mouth daily. Take 1 daily with food. 05/28/16   Harris, Abigail, PA-C  ondansetron (ZOFRAN) 4 MG tablet Take 1 tablet (4 mg total) by mouth every 6 (six) hours. 01/03/18   Law, Waylan Boga, PA-C  polyvinyl alcohol-povidone (REFRESH) 1.4-0.6 % ophthalmic solution Place 1-2 drops into both eyes as needed. Patient not taking: Reported on 07/01/2016 01/03/15   Horton, Mayer Masker, MD  Pregabalin (LYRICA PO) Take by mouth.    [provider]  TIZANIDINE HCL PO Take by mouth.    [provider]  TRAMADOL HCL ER PO Take by mouth.    [provider]    Family History Family History  Problem Relation Age of Onset  . Diabetes Mother   . Hypertension Mother   . Diabetes Father   . Hypertension Father     Social History Social History   Tobacco Use  . Smoking status: Current Every Day Smoker    Types: Cigarettes  .  Smokeless tobacco: Never Used  Substance Use Topics  . Alcohol use: No  . Drug use: No     Allergies   Patient has no known allergies.   Review of Systems Review of Systems  Constitutional: Positive for chills. Negative for fever.  HENT: Positive for sore throat. Negative for tinnitus.   Eyes: Negative for photophobia and redness.  Respiratory: Positive for cough. Negative for shortness of breath.   Cardiovascular: Negative for chest pain and palpitations.  Gastrointestinal: Negative for abdominal pain, diarrhea, nausea and vomiting.  Genitourinary: Negative for dysuria, frequency and urgency.  Musculoskeletal: Negative for myalgias.  Skin: Negative for rash.       No lesions  Neurological: Positive for headaches. Negative for weakness.  Hematological: Does not bruise/bleed easily.  Psychiatric/Behavioral: Negative for suicidal ideas.     Physical Exam Triage Vital Signs ED Triage Vitals  Enc Vitals Group     BP 03/04/18 1827 125/76      Pulse Rate 03/04/18 1827 82     Resp 03/04/18 1827 20     Temp 03/04/18 1827 97.9 F (36.6 C)     Temp Source 03/04/18 1827 Oral     SpO2 03/04/18 1827 99 %     Weight --      Height --      Head Circumference --      Peak Flow --      Pain Score 03/04/18 1825 5     Pain Loc --      Pain Edu? --      Excl. in GC? --    No data found.  Updated Vital Signs BP 125/76 (BP Location: Right Arm)   Pulse 82   Temp 97.9 F (36.6 C) (Oral)   Resp 20   LMP 01/26/2018   SpO2 99%   Visual Acuity Right Eye Distance:   Left Eye Distance:   Bilateral Distance:    Right Eye Near:   Left Eye Near:    Bilateral Near:     Physical Exam  Constitutional: She is oriented to person, place, and time. She appears well-developed and well-nourished. No distress.  HENT:  Head: Normocephalic and atraumatic.  Mouth/Throat: Oropharynx is clear and moist.  Eyes: Pupils are equal, round, and reactive to light. Conjunctivae and EOM are normal. No scleral icterus.  Neck: Normal range of motion. Neck supple. No JVD present. No tracheal deviation present. No thyromegaly present.  Cardiovascular: Normal rate, regular rhythm and normal heart sounds. Exam reveals no gallop and no friction rub.  No murmur heard. Pulmonary/Chest: Effort normal and breath sounds normal.  Abdominal: Soft. Bowel sounds are normal. She exhibits no distension. There is no tenderness.  Musculoskeletal: Normal range of motion. She exhibits no edema.  Lymphadenopathy:    She has no cervical adenopathy.  Neurological: She is alert and oriented to person, place, and time. No cranial nerve deficit.  Skin: Skin is warm and dry.  Psychiatric: She has a normal mood and affect. Her behavior is normal. Judgment and thought content normal.  Nursing note and vitals reviewed.    UC Treatments / Results  Labs (all labs ordered are listed, but only abnormal results are displayed) Labs Reviewed  CULTURE, GROUP A STREP [facility])  POCT RAPID  STREP A    EKG None  Radiology No results found.  Procedures Procedures (including critical care time)  Medications Ordered in UC Medications - No data to display  Initial Impression / Assessment and Plan / UC Course  I have reviewed the triage vital signs and the nursing notes.  Pertinent labs & imaging results that were available during my care of the patient were reviewed by me and considered in my medical decision making (see chart for details).     Symptoms consistent with viral infection.  Rapid strep to be safe.  Advised symptomatic care.  Tylenol for fever, ibuprofen as needed for body aches.  Over-the-counter decongestant for sinus congestion.  Final Clinical Impressions(s) / UC Diagnoses   Final diagnoses:  Viral URI with cough   Discharge Instructions   None    ED Prescriptions    Medication Sig Dispense Auth. Provider   benzonatate (TESSALON) 100 MG capsule Take 1 capsule (100 mg total) by mouth every 8 (eight) hours. 21 capsule Arnaldo Natal, MD     Controlled Substance Prescriptions [place] Controlled Substance Registry consulted? Not Applicable   Arnaldo Natal, MD 03/04/18 [phone]

## 2018-03-04 NOTE — ED Triage Notes (Signed)
Pt presents with cold symptoms; nasal drainage, persistent cough, chills and generalized body aches.

## 2018-03-07 LAB — CULTURE, GROUP A STREP (THRC)

## 2018-03-19 DIAGNOSIS — G4730 Sleep apnea, unspecified: Secondary | ICD-10-CM | POA: Diagnosis not present

## 2018-03-19 NOTE — Procedures (Signed)
   Patient Name: Brandi Lopez, Brandi Lopez Study Date: 02/21/2018 Gender: Female D.O.B: [birth year] Age (years): 37 Referring Provider: Fleet ContrasEdwin Avbuere Height (inches): 64 Interpreting Physician: Jetty Duhamellinton Zyah Gomm MD, ABSM Weight (lbs): 172 RPSGT: Shelah LewandowskyGregory, Kenyon BMI: 30 MRN: [id number] Neck Size: 14.00  CLINICAL INFORMATION Sleep Study Type: NPSG Indication for sleep study: Excessive Daytime Sleepiness, Fatigue, Morning Headaches, Snoring Epworth Sleepiness Score: 8  SLEEP STUDY TECHNIQUE As per the AASM Manual for the Scoring of Sleep and Associated Events v2.3 (April 2016) with a hypopnea requiring 4% desaturations.  The channels recorded and monitored were frontal, central and occipital EEG, electrooculogram (EOG), submentalis EMG (chin), nasal and oral airflow, thoracic and abdominal wall motion, anterior tibialis EMG, snore microphone, electrocardiogram, and pulse oximetry.  MEDICATIONS Medications self-administered by patient taken the night of the study : AMBIEN  SLEEP ARCHITECTURE The study was initiated at 10:49:33 PM and ended at 4:58:04 AM.  Sleep onset time was 13.7 minutes and the sleep efficiency was 84.7%%. The total sleep time was 312 minutes.  Stage REM latency was 175.0 minutes.  The patient spent 11.7%% of the night in stage N1 sleep, 68.4%% in stage N2 sleep, 0.0%% in stage N3 and 19.9% in REM.  Alpha intrusion was absent.  Supine sleep was 13.62%.  RESPIRATORY PARAMETERS The overall apnea/hypopnea index (AHI) was 4.2 per hour. There were 1 total apneas, including 0 obstructive, 1 central and 0 mixed apneas. There were 21 hypopneas and 32 RERAs.  The AHI during Stage REM sleep was 4.8 per hour.  AHI while supine was 14.1 per hour.  The mean oxygen saturation was 94.2%. The minimum SpO2 during sleep was 89.0%.  soft snoring was noted during this study.  CARDIAC DATA The 2 lead EKG demonstrated sinus rhythm. The mean heart rate was 71.1 beats per minute. Other EKG  findings include: None.  LEG MOVEMENT DATA The total PLMS were 0 with a resulting PLMS index of 0.0. Associated arousal with leg movement index was 1.2 .  IMPRESSIONS - Occasional obstructive sleep apneas occurred during this study, within normal limits (AHI = 4.2/h). - No significant central sleep apnea occurred during this study (CAI = 0.2/h). - The patient had minimal or no oxygen desaturation during the study (Min O2 = 89.0%), Mean 94.4%. - The patient snored with soft snoring volume. - No cardiac abnormalities were noted during this study. - Clinically significant periodic limb movements did not occur during sleep. No significant associated arousals.  DIAGNOSIS - Primary snoring  RECOMMENDATIONS - Consider conserrvative measures including observation, weight loss and sleep position off back. - Be careful with alcohol, sedatives and other CNS depressants that may worsen sleep apnea and disrupt normal sleep architecture. - Sleep hygiene should be reviewed to assess factors that may improve sleep quality. - Weight management and regular exercise should be initiated or continued if appropriate.  [Electronically signed] 03/19/2018 03:05 PM  Jetty Duhamellinton Orlanda Lemmerman MD, ABSM Diplomate, American Board of Sleep Medicine   NPI: [id number]                          Jetty Duhamellinton Tyke Outman Diplomate, American Board of Sleep Medicine  ELECTRONICALLY SIGNED ON:  03/19/2018, 3:02 PM  [facility] PH: [phone]   FX: [phone] ACCREDITED BY THE AMERICAN ACADEMY OF SLEEP MEDICINE

## 2018-04-04 ENCOUNTER — Other Ambulatory Visit: Payer: Self-pay | Admitting: Physician Assistant

## 2018-04-04 DIAGNOSIS — R1031 Right lower quadrant pain: Secondary | ICD-10-CM

## 2018-04-04 DIAGNOSIS — R1011 Right upper quadrant pain: Secondary | ICD-10-CM

## 2018-04-07 ENCOUNTER — Other Ambulatory Visit: Payer: Medicaid Other

## 2018-05-08 ENCOUNTER — Other Ambulatory Visit: Payer: Self-pay

## 2018-05-08 ENCOUNTER — Ambulatory Visit (HOSPITAL_COMMUNITY)
Admission: EM | Admit: 2018-05-08 | Discharge: 2018-05-08 | Disposition: A | Discharge status: Home / Self Care | Payer: Medicaid Other | Attending: Family Medicine | Admitting: Family Medicine

## 2018-05-08 ENCOUNTER — Encounter (HOSPITAL_COMMUNITY): Payer: Self-pay | Admitting: Emergency Medicine

## 2018-05-08 DIAGNOSIS — B349 Viral infection, unspecified: Secondary | ICD-10-CM | POA: Diagnosis not present

## 2018-05-08 MED ORDER — ACETAMINOPHEN 325 MG PO TABS
ORAL_TABLET | ORAL | Status: AC
  Filled 2018-05-08: qty 2

## 2018-05-08 MED ORDER — FLUTICASONE PROPIONATE 50 MCG/ACT NA SUSP: 2.0000 | Freq: Every day | NASAL | 0 refills | Status: DC

## 2018-05-08 MED ORDER — ACETAMINOPHEN 325 MG PO TABS
650.0000 mg | ORAL_TABLET | Freq: Once | ORAL | Status: AC
  Administered 2018-05-08: 650 mg via ORAL

## 2018-05-08 MED ORDER — PREDNISONE 50 MG PO TABS: 50.0000 mg | ORAL_TABLET | Freq: Every day | ORAL | 0 refills | Status: DC

## 2018-05-08 MED ORDER — IPRATROPIUM BROMIDE 0.06 % NA SOLN: 2.0000 | Freq: Four times a day (QID) | NASAL | 0 refills | Status: DC

## 2018-05-08 NOTE — ED Provider Notes (Signed)
[facility]    CSN: [id number] Arrival date & time: 05/08/18  1340     History   Chief Complaint Chief Complaint  Patient presents with  . Influenza    HPI Brandi NovasMonica Lopez is a 38 y.o. female.   38 year old female comes in for 3-4 day history of URI symptoms.  She has had rhinorrhea, nasal congestion, facial pressure, cough, fever.  She has been taking OTC cold medication with some relief.  States she traveled domestically on [place] 30, [place], then to [place].  She wore a mask throughout the whole flight, but is worried about coronavirus.  She denies chest pain, shortness of breath, wheezing.  Current everyday smoker.     Past Medical History:  Diagnosis Date  . Anxiety   . Herniated cervical disc   . Hyperlipemia   . Neck pain, chronic     There are no active problems to display for this patient.   Past Surgical History:  Procedure Laterality Date  . CERVICAL DISC SURGERY      OB History    Gravida  2   Para      Term      Preterm      AB  1   Living  1     SAB  1   TAB      Ectopic      Multiple      Live Births               Home Medications    Prior to Admission medications   Medication Sig Start Date End Date Taking? Authorizing Provider  TIZANIDINE HCL PO Take by mouth.   Yes [provider]  zolpidem (AMBIEN) 10 MG tablet Take 10 mg by mouth at bedtime as needed for sleep.   Yes [provider]  fluticasone (FLONASE) 50 MCG/ACT nasal spray Place 2 sprays into both nostrils daily. 05/08/18   Cathie HoopsYu, Ragena Fiola V, PA-C  gabapentin (NEURONTIN) 100 MG capsule Take 1 capsule (100 mg total) by mouth 3 (three) times daily. 05/20/16 08/18/16  Joy, Shawn C, PA-C  ibuprofen (ADVIL,MOTRIN) 600 MG tablet Take 1 tablet (600 mg total) by mouth every 6 (six) hours as needed. 08/31/16   Luevenia MaxinFawze, Mina A, PA-C  ipratropium (ATROVENT) 0.06 % nasal spray Place 2 sprays into both nostrils 4 (four)  times daily. 05/08/18   Cathie HoopsYu, Dorris Vangorder V, PA-C  LORazepam (ATIVAN) 0.5 MG tablet Take 1 tablet (0.5 mg total) by mouth 3 (three) times daily as needed for anxiety. 01/12/17   Rise MuLeaphart, Kenneth T, PA-C  meloxicam (MOBIC) 15 MG tablet Take 1 tablet (15 mg total) by mouth daily. Take 1 daily with food. 05/28/16   Harris, Abigail, PA-C  ondansetron (ZOFRAN) 4 MG tablet Take 1 tablet (4 mg total) by mouth every 6 (six) hours. 01/03/18   Law, Waylan BogaAlexandra M, PA-C  polyvinyl alcohol-povidone (REFRESH) 1.4-0.6 % ophthalmic solution Place 1-2 drops into both eyes as needed. Patient not taking: Reported on 07/01/2016 01/03/15   Horton, Mayer Maskerourtney F, MD  predniSONE (DELTASONE) 50 MG tablet Take 1 tablet (50 mg total) by mouth daily. 05/08/18   Cathie HoopsYu, Ivery Michalski V, PA-C  Pregabalin (LYRICA PO) Take by mouth.    [provider]  TRAMADOL HCL ER PO Take by mouth.    [provider]    Family History Family History  Problem Relation Age of Onset  . Diabetes Mother   .  Hypertension Mother   . Diabetes Father   . Hypertension Father     Social History Social History   Tobacco Use  . Smoking status: Current Every Day Smoker    Types: Cigarettes  . Smokeless tobacco: Never Used  Substance Use Topics  . Alcohol use: No  . Drug use: No     Allergies   Patient has no known allergies.   Review of Systems Review of Systems  Reason unable to perform ROS: See HPI as above.     Physical Exam Triage Vital Signs ED Triage Vitals  Enc Vitals Group     BP 05/08/18 1411 124/70     Pulse Rate 05/08/18 1411 (!) 106     Resp 05/08/18 1411 16     Temp 05/08/18 1411 100.3 F (37.9 C)     Temp Source 05/08/18 1411 Oral     SpO2 05/08/18 1411 100 %     Weight --      Height --      Head Circumference --      Peak Flow --      Pain Score 05/08/18 1404 7     Pain Loc --      Pain Edu? --      Excl. in GC? --    No data found.  Updated Vital Signs BP 124/70   Pulse (!) 106   Temp 100.3 F (37.9 C)  (Oral)   Resp 16   LMP 05/07/2018   SpO2 100%   Physical Exam Constitutional:      General: She is not in acute distress.    Appearance: She is well-developed. She is not ill-appearing, toxic-appearing or diaphoretic.  HENT:     Head: Normocephalic and atraumatic.     Right Ear: Tympanic membrane, ear canal and external ear normal. Tympanic membrane is not erythematous or bulging.     Left Ear: Tympanic membrane, ear canal and external ear normal. Tympanic membrane is not erythematous or bulging.     Nose:     Right Sinus: Maxillary sinus tenderness and frontal sinus tenderness present.     Left Sinus: Maxillary sinus tenderness and frontal sinus tenderness present.     Mouth/Throat:     Pharynx: Uvula midline.  Eyes:     Conjunctiva/sclera: Conjunctivae normal.     Pupils: Pupils are equal, round, and reactive to light.  Neck:     Musculoskeletal: Normal range of motion and neck supple.  Cardiovascular:     Rate and Rhythm: Normal rate and regular rhythm.     Heart sounds: Normal heart sounds. No murmur. No friction rub. No gallop.   Pulmonary:     Effort: Pulmonary effort is normal.     Breath sounds: Normal breath sounds. No decreased breath sounds, wheezing, rhonchi or rales.  Lymphadenopathy:     Cervical: No cervical adenopathy.  Skin:    General: Skin is warm and dry.  Neurological:     Mental Status: She is alert and oriented to person, place, and time.  Psychiatric:        Behavior: Behavior normal.        Judgment: Judgment normal.      UC Treatments / Results  Labs (all labs ordered are listed, but only abnormal results are displayed) Labs Reviewed - No data to display  EKG None  Radiology No results found.  Procedures Procedures (including critical care time)  Medications Ordered in UC Medications  acetaminophen (TYLENOL) tablet 650 mg (650  mg Oral Given 05/08/18 1536)    Initial Impression / Assessment and Plan / UC Course  I have reviewed the  triage vital signs and the nursing notes.  Pertinent labs & imaging results that were available during my care of the patient were reviewed by me and considered in my medical decision making (see chart for details).    Discussed with patient, given patient without close contact with people from [place], confirmed coronavirus case, low suspicion for coronavirus infection. Discussed per current CDC guidelines, no testing needed. Discussed possible influenza, other virus causing symptoms.  Will provide prednisone for sinus pressure.  Other symptomatic treatment discussed.  Push fluids.  Return precautions given.  Patient expresses understanding and agrees to plan.  Case discussed with Dr Delton See, who agrees to plan.  Final Clinical Impressions(s) / UC Diagnoses   Final diagnoses:  Viral illness    ED Prescriptions    Medication Sig Dispense Auth. Provider   predniSONE (DELTASONE) 50 MG tablet Take 1 tablet (50 mg total) by mouth daily. 5 tablet Rebekah Zackery V, PA-C   fluticasone (FLONASE) 50 MCG/ACT nasal spray Place 2 sprays into both nostrils daily. 1 g Kodah Maret V, PA-C   ipratropium (ATROVENT) 0.06 % nasal spray Place 2 sprays into both nostrils 4 (four) times daily. 15 mL Threasa Alpha, [place] 05/08/18 [phone]

## 2018-05-08 NOTE — ED Triage Notes (Addendum)
PT reports cough, congestion, headache, runny eyes, congestion, fever.   PT traveled domestically on 1/30. This is the day symptoms started. PT is concerned about exposure to other travelers and coronavirus.

## 2018-05-08 NOTE — Discharge Instructions (Signed)
Prednisone as directed. Start flonase, atrovent nasal spray for nasal congestion/drainage. You can use over the counter nasal saline rinse such as neti pot for nasal congestion. Keep hydrated, your urine should be clear to pale yellow in color. Tylenol/motrin for fever and pain. Monitor for any worsening of symptoms, chest pain, shortness of breath, wheezing, swelling of the throat, follow up for reevaluation.  ° °For sore throat/cough try using a honey-based tea. Use 3 teaspoons of honey with juice squeezed from half lemon. Place shaved pieces of ginger into 1/2-1 cup of water and warm over stove top. Then mix the ingredients and repeat every 4 hours as needed. °

## 2019-05-15 ENCOUNTER — Other Ambulatory Visit: Payer: Self-pay | Admitting: Cardiology

## 2019-05-15 DIAGNOSIS — Z20822 Contact with and (suspected) exposure to covid-19: Secondary | ICD-10-CM

## 2019-05-16 LAB — NOVEL CORONAVIRUS, NAA: SARS-CoV-2, NAA: NOT DETECTED

## 2019-06-22 ENCOUNTER — Ambulatory Visit: Payer: Medicaid Other | Attending: Internal Medicine

## 2019-06-29 ENCOUNTER — Emergency Department (HOSPITAL_BASED_OUTPATIENT_CLINIC_OR_DEPARTMENT_OTHER)
Admission: EM | Admit: 2019-06-29 | Discharge: 2019-06-29 | Disposition: A | Discharge status: Home / Self Care | Payer: BC Managed Care – PPO | Attending: Emergency Medicine | Admitting: Emergency Medicine

## 2019-06-29 ENCOUNTER — Encounter (HOSPITAL_BASED_OUTPATIENT_CLINIC_OR_DEPARTMENT_OTHER): Payer: Self-pay

## 2019-06-29 ENCOUNTER — Other Ambulatory Visit: Payer: Self-pay

## 2019-06-29 ENCOUNTER — Emergency Department (HOSPITAL_BASED_OUTPATIENT_CLINIC_OR_DEPARTMENT_OTHER): Payer: BC Managed Care – PPO

## 2019-06-29 DIAGNOSIS — W010XXA Fall on same level from slipping, tripping and stumbling without subsequent striking against object, initial encounter: Secondary | ICD-10-CM | POA: Insufficient documentation

## 2019-06-29 DIAGNOSIS — S39012A Strain of muscle, fascia and tendon of lower back, initial encounter: Secondary | ICD-10-CM | POA: Diagnosis not present

## 2019-06-29 DIAGNOSIS — F17210 Nicotine dependence, cigarettes, uncomplicated: Secondary | ICD-10-CM | POA: Insufficient documentation

## 2019-06-29 DIAGNOSIS — W19XXXA Unspecified fall, initial encounter: Secondary | ICD-10-CM

## 2019-06-29 DIAGNOSIS — Y990 Civilian activity done for income or pay: Secondary | ICD-10-CM | POA: Diagnosis not present

## 2019-06-29 DIAGNOSIS — R112 Nausea with vomiting, unspecified: Secondary | ICD-10-CM | POA: Insufficient documentation

## 2019-06-29 DIAGNOSIS — R197 Diarrhea, unspecified: Secondary | ICD-10-CM | POA: Diagnosis not present

## 2019-06-29 DIAGNOSIS — Z79899 Other long term (current) drug therapy: Secondary | ICD-10-CM | POA: Insufficient documentation

## 2019-06-29 DIAGNOSIS — S3992XA Unspecified injury of lower back, initial encounter: Secondary | ICD-10-CM | POA: Diagnosis present

## 2019-06-29 DIAGNOSIS — E785 Hyperlipidemia, unspecified: Secondary | ICD-10-CM | POA: Insufficient documentation

## 2019-06-29 DIAGNOSIS — Y9389 Activity, other specified: Secondary | ICD-10-CM | POA: Insufficient documentation

## 2019-06-29 DIAGNOSIS — R1031 Right lower quadrant pain: Secondary | ICD-10-CM | POA: Insufficient documentation

## 2019-06-29 DIAGNOSIS — Y929 Unspecified place or not applicable: Secondary | ICD-10-CM | POA: Diagnosis not present

## 2019-06-29 LAB — CBC WITH DIFFERENTIAL/PLATELET
Abs Immature Granulocytes: 0.02 10*3/uL (ref 0.00–0.07)
Basophils Absolute: 0 10*3/uL (ref 0.0–0.1)
Basophils Relative: 1 %
Eosinophils Absolute: 0.1 10*3/uL (ref 0.0–0.5)
Eosinophils Relative: 1 %
HCT: 40.7 % (ref 36.0–46.0)
Hemoglobin: 13.1 g/dL (ref 12.0–15.0)
Immature Granulocytes: 0 %
Lymphocytes Relative: 27 %
Lymphs Abs: 2.2 10*3/uL (ref 0.7–4.0)
MCH: 28.1 pg (ref 26.0–34.0)
MCHC: 32.2 g/dL (ref 30.0–36.0)
MCV: 87.3 fL (ref 80.0–100.0)
Monocytes Absolute: 0.7 10*3/uL (ref 0.1–1.0)
Monocytes Relative: 9 %
Neutro Abs: 5.1 10*3/uL (ref 1.7–7.7)
Neutrophils Relative %: 62 %
Platelets: 379 10*3/uL (ref 150–400)
RBC: 4.66 MIL/uL (ref 3.87–5.11)
RDW: 13.5 % (ref 11.5–15.5)
WBC: 8.1 10*3/uL (ref 4.0–10.5)
nRBC: 0 % (ref 0.0–0.2)

## 2019-06-29 LAB — LIPASE, BLOOD: Lipase: 29 U/L (ref 11–51)

## 2019-06-29 LAB — COMPREHENSIVE METABOLIC PANEL
ALT: 20 U/L (ref 0–44)
AST: 19 U/L (ref 15–41)
Albumin: 4.2 g/dL (ref 3.5–5.0)
Alkaline Phosphatase: 41 U/L (ref 38–126)
Anion gap: 11 (ref 5–15)
BUN: 13 mg/dL (ref 6–20)
CO2: 22 mmol/L (ref 22–32)
Calcium: 8.9 mg/dL (ref 8.9–10.3)
Chloride: 101 mmol/L (ref 98–111)
Creatinine, Ser: 0.6 mg/dL (ref 0.44–1.00)
GFR calc Af Amer: >60 mL/min (ref >60)
GFR calc non Af Amer: >60 mL/min (ref >60)
Glucose, Bld: 96 mg/dL (ref 70–99)
Potassium: 3.7 mmol/L (ref 3.5–5.1)
Sodium: 134 mmol/L — ABNORMAL LOW (ref 135–145)
Total Bilirubin: 0.8 mg/dL (ref 0.3–1.2)
Total Protein: 7.7 g/dL (ref 6.5–8.1)

## 2019-06-29 LAB — URINALYSIS, ROUTINE W REFLEX MICROSCOPIC
Bilirubin Urine: NEGATIVE
Glucose, UA: NEGATIVE mg/dL
Hgb urine dipstick: NEGATIVE
Ketones, ur: NEGATIVE mg/dL
Leukocytes,Ua: NEGATIVE
Nitrite: NEGATIVE
Protein, ur: NEGATIVE mg/dL
Specific Gravity, Urine: <1.005 — ABNORMAL LOW (ref 1.005–1.030)
pH: 7 (ref 5.0–8.0)

## 2019-06-29 LAB — PREGNANCY, URINE: Preg Test, Ur: NEGATIVE

## 2019-06-29 MED ORDER — CYCLOBENZAPRINE HCL 10 MG PO TABS: 10.0000 mg | ORAL_TABLET | Freq: Two times a day (BID) | ORAL | 0 refills | Status: DC | PRN

## 2019-06-29 MED ORDER — SODIUM CHLORIDE 0.9 % IV BOLUS
1000.0000 mL | Freq: Once | INTRAVENOUS | Status: AC
  Administered 2019-06-29: 1000 mL via INTRAVENOUS

## 2019-06-29 MED ORDER — IOHEXOL 300 MG/ML  SOLN
100.0000 mL | Freq: Once | INTRAMUSCULAR | Status: AC | PRN
  Administered 2019-06-29: 100 mL via INTRAVENOUS

## 2019-06-29 MED ORDER — ONDANSETRON HCL 4 MG/2ML IJ SOLN
4.0000 mg | Freq: Once | INTRAMUSCULAR | Status: AC
  Administered 2019-06-29: 4 mg via INTRAVENOUS
  Filled 2019-06-29: qty 2

## 2019-06-29 MED ORDER — MORPHINE SULFATE (PF) 4 MG/ML IV SOLN
4.0000 mg | Freq: Once | INTRAVENOUS | Status: AC
  Administered 2019-06-29: 16:00:00 4 mg via INTRAVENOUS
  Filled 2019-06-29: qty 1

## 2019-06-29 MED ORDER — ONDANSETRON 4 MG PO TBDP: 4.0000 mg | ORAL_TABLET | Freq: Three times a day (TID) | ORAL | 0 refills | Status: DC | PRN

## 2019-06-29 NOTE — ED Notes (Signed)
Pt to CT scan.

## 2019-06-29 NOTE — ED Provider Notes (Signed)
[facility] EMERGENCY DEPARTMENT Provider Note   CSN: [id number] Arrival date & time: 06/29/19  1542     History Chief Complaint  Patient presents with  . Abdominal Pain    Brandi Lopez is a 39 y.o. female who presents to ED for multiple complaints. Her first complaint is right lower abdominal pain and pressure sensation that has worsened over the past 2 days.  She had one episode of nonbloody diarrhea yesterday and 2 episodes of NBNB emesis yesterday.  This morning woke up with worsening pain in her right lower quadrant and feelings of constipation although did have a "pebble like" bowel movement today.  She has been able to pass gas.  Denies any urinary symptoms.  She has not tried any medications to help with her symptoms. Her next complaint is lower back pain after fall that occurred approximately 1 week ago while at work.  States that she was trying to pull something from the ground when she slipped and landed on her back.  She has had muscle pain throughout her entire back but has some midline pain in her lower back.  She has been taking her home medications including tramadol with only minimal improvement in her symptoms. Denies any numbness in arms or legs, loss of bowel or bladder function, prior back surgeries, head injury, loss of consciousness, vaginal complaints.  HPI     Past Medical History:  Diagnosis Date  . Anxiety   . Herniated cervical disc   . Hyperlipemia   . Neck pain, chronic     There are no problems to display for this patient.   Past Surgical History:  Procedure Laterality Date  . CERVICAL DISC SURGERY    . NOSE SURGERY       OB History    Gravida  2   Para      Term      Preterm      AB  1   Living  1     SAB  1   TAB      Ectopic      Multiple      Live Births              Family History  Problem Relation Age of Onset  . Diabetes Mother   . Hypertension Mother   . Diabetes Father   . Hypertension Father       Social History   Tobacco Use  . Smoking status: Current Every Day Smoker    Types: Cigarettes  . Smokeless tobacco: Never Used  Substance Use Topics  . Alcohol use: No  . Drug use: Yes    Types: Marijuana    Home Medications Prior to Admission medications   Medication Sig Start Date End Date Taking? Authorizing Provider  cyclobenzaprine (FLEXERIL) 10 MG tablet Take 1 tablet (10 mg total) by mouth 2 (two) times daily as needed for muscle spasms. 06/29/19   Kylani Wires, PA-C  fluticasone (FLONASE) 50 MCG/ACT nasal spray Place 2 sprays into both nostrils daily. 05/08/18   Cathie Hoops, Amy V, PA-C  gabapentin (NEURONTIN) 100 MG capsule Take 1 capsule (100 mg total) by mouth 3 (three) times daily. 05/20/16 08/18/16  Joy, Shawn C, PA-C  ibuprofen (ADVIL,MOTRIN) 600 MG tablet Take 1 tablet (600 mg total) by mouth every 6 (six) hours as needed. 08/31/16   Luevenia Maxin, Mina A, PA-C  ipratropium (ATROVENT) 0.06 % nasal spray Place 2 sprays into both nostrils 4 (four) times daily. 05/08/18  Tasia Catchings, Amy V, PA-C  LORazepam (ATIVAN) 0.5 MG tablet Take 1 tablet (0.5 mg total) by mouth 3 (three) times daily as needed for anxiety. 01/12/17   Doristine Devoid, PA-C  meloxicam (MOBIC) 15 MG tablet Take 1 tablet (15 mg total) by mouth daily. Take 1 daily with food. 05/28/16   Margarita Mail, PA-C  ondansetron (ZOFRAN ODT) 4 MG disintegrating tablet Take 1 tablet (4 mg total) by mouth every 8 (eight) hours as needed for nausea or vomiting. 06/29/19   Devonte Migues, PA-C  ondansetron (ZOFRAN) 4 MG tablet Take 1 tablet (4 mg total) by mouth every 6 (six) hours. 01/03/18   Law, Bea Graff, PA-C  polyvinyl alcohol-povidone (REFRESH) 1.4-0.6 % ophthalmic solution Place 1-2 drops into both eyes as needed. Patient not taking: Reported on 07/01/2016 01/03/15   Horton, Barbette Hair, MD  predniSONE (DELTASONE) 50 MG tablet Take 1 tablet (50 mg total) by mouth daily. 05/08/18   Tasia Catchings, Amy V, PA-C  Pregabalin (LYRICA PO) Take by mouth.    [provider]  TIZANIDINE HCL PO Take by mouth.    [provider]  TRAMADOL HCL ER PO Take by mouth.    [provider]  zolpidem (AMBIEN) 10 MG tablet Take 10 mg by mouth at bedtime as needed for sleep.    [provider]    Allergies    Patient has no known allergies.  Review of Systems   Review of Systems  Constitutional: Negative for appetite change, chills and fever.  HENT: Negative for ear pain, rhinorrhea, sneezing and sore throat.   Eyes: Negative for photophobia and visual disturbance.  Respiratory: Negative for cough, chest tightness, shortness of breath and wheezing.   Cardiovascular: Negative for chest pain and palpitations.  Gastrointestinal: Positive for abdominal pain, diarrhea, nausea and vomiting. Negative for blood in stool and constipation.  Genitourinary: Negative for dysuria, hematuria and urgency.  Musculoskeletal: Positive for back pain and myalgias.  Skin: Negative for rash.  Neurological: Negative for dizziness, weakness and light-headedness.    Physical Exam Updated Vital Signs BP 112/73 (BP Location: Left Arm)   Pulse 86   Temp 98.3 F (36.8 C) (Oral)   Resp 18   LMP 06/05/2019   SpO2 100%   Physical Exam Vitals and nursing note reviewed.  Constitutional:      General: She is not in acute distress.    Appearance: She is well-developed.  HENT:     Head: Normocephalic and atraumatic.     Nose: Nose normal.  Eyes:     General: No scleral icterus.       Left eye: No discharge.     Conjunctiva/sclera: Conjunctivae normal.  Cardiovascular:     Rate and Rhythm: Normal rate and regular rhythm.     Heart sounds: Normal heart sounds. No murmur. No friction rub. No gallop.   Pulmonary:     Effort: Pulmonary effort is normal. No respiratory distress.     Breath sounds: Normal breath sounds.  Abdominal:     General: Bowel sounds are normal. There is no distension.     Palpations: Abdomen is soft.     Tenderness: There is  abdominal tenderness in the right lower quadrant, suprapubic area and left lower quadrant. There is no guarding.  Musculoskeletal:        General: Normal range of motion.     Cervical back: Normal range of motion and neck supple. Tenderness present. No bony tenderness.     Lumbar back:  Tenderness and bony tenderness present.       Back:     Comments: Tenderness to palpation of the lumbar spine at the midline, tenderness palpation at the paraspinal musculature of the cervical and thoracic spine without midline tenderness. No step-off palpated. No visible bruising, edema or temperature change noted. No objective signs of numbness present. No saddle anesthesia. 2+ DP pulses bilaterally. Sensation intact to light touch. Strength 5/5 in bilateral lower extremities. Normal gait.  Skin:    General: Skin is warm and dry.     Findings: No rash.  Neurological:     Mental Status: She is alert.     Motor: No abnormal muscle tone.     Coordination: Coordination normal.     ED Results / Procedures / Treatments   Labs (all labs ordered are listed, but only abnormal results are displayed) Labs Reviewed  COMPREHENSIVE METABOLIC PANEL - Abnormal; Notable for the following components:      Result Value   Sodium 134 (*)    All other components within normal limits  URINALYSIS, ROUTINE W REFLEX MICROSCOPIC - Abnormal; Notable for the following components:   Specific Gravity, Urine <1.005 (*)    All other components within normal limits  LIPASE, BLOOD  CBC WITH DIFFERENTIAL/PLATELET  PREGNANCY, URINE    EKG None  Radiology DG Lumbar Spine Complete  Result Date: 06/29/2019 CLINICAL DATA:  39 year old female with fall back pain. EXAM: LUMBAR SPINE - COMPLETE 4+ VIEW COMPARISON:  CT abdomen pelvis dated 06/29/2019. FINDINGS: Five lumbar type vertebra. There is no acute fracture or subluxation of the lumbar spine. The vertebral body heights and disc spaces are maintained. The visualized posterior  elements are intact. The soft tissues are unremarkable. IMPRESSION: Negative. Electronically Signed   By: Elgie CollardArash  Radparvar M.D.   On: 06/29/2019 17:21   CT ABDOMEN PELVIS W CONTRAST  Result Date: 06/29/2019 CLINICAL DATA:  Lower abdominal pain for 3 days. Fell at work last week. EXAM: CT ABDOMEN AND PELVIS WITH CONTRAST TECHNIQUE: Multidetector CT imaging of the abdomen and pelvis was performed using the standard protocol following bolus administration of intravenous contrast. CONTRAST:  100mL OMNIPAQUE IOHEXOL 300 MG/ML  SOLN COMPARISON:  None. FINDINGS: Lower chest: The lung bases are clear of acute process. No pleural effusion or pulmonary lesions. The heart is normal in size. No pericardial effusion. The distal esophagus and aorta are unremarkable. Hepatobiliary: No focal hepatic lesions or intrahepatic biliary dilatation. The gallbladder is normal. No common bile duct dilatation. Pancreas: No mass, inflammation or ductal dilatation. Spleen: Normal size. No focal lesions. Adrenals/Urinary Tract: The adrenal glands and kidneys are unremarkable. The bladder is normal. Stomach/Bowel: The stomach, duodenum, small bowel and colon are grossly normal without oral contrast. No acute inflammatory changes, mass lesions or obstructive findings. The terminal ileum is normal. The appendix is normal. Vascular/Lymphatic: The aorta is normal in caliber. No dissection. The branch vessels are patent. The major venous structures are patent. No mesenteric or retroperitoneal mass or adenopathy. Small scattered lymph nodes are noted. Reproductive: The uterus and ovaries are normal. There may be some type of birth control ring in the vagina. Other: No pelvic mass or adenopathy. No free pelvic fluid collections. No inguinal mass or adenopathy. No abdominal wall hernia or subcutaneous lesions. Musculoskeletal: No significant bony findings. IMPRESSION: Unremarkable CT abdomen/pelvis. No acute abdominal/pelvic findings, mass lesions  or adenopathy. Electronically Signed   By: Rudie MeyerP.  Gallerani M.D.   On: 06/29/2019 17:24    Procedures Procedures (including critical  care time)  Medications Ordered in ED Medications  sodium chloride 0.9 % bolus 1,000 mL (1,000 mLs Intravenous New Bag/Given 06/29/19 1618)  morphine 4 MG/ML injection 4 mg (4 mg Intravenous Given 06/29/19 1618)  ondansetron (ZOFRAN) injection 4 mg (4 mg Intravenous Given 06/29/19 1618)  iohexol (OMNIPAQUE) 300 MG/ML solution 100 mL (100 mLs Intravenous Contrast Given 06/29/19 1702)    ED Course  I have reviewed the triage vital signs and the nursing notes.  Pertinent labs & imaging results that were available during my care of the patient were reviewed by me and considered in my medical decision making (see chart for details).    MDM Rules/Calculators/A&P                      39 year old female presents to ED for multiple complaints. 1.  Right lower abdominal pain and pressure sensation with one episode of diarrhea and 2 episodes of vomiting yesterday.  Today has nausea but denies any diarrhea or vomiting.  She has been able to pass gas.  Denies urinary symptoms and no sick contacts with similar symptoms.  On exam there is tenderness palpation of the lower abdomen without rebound or guarding, worse in the right lower quadrant.  She denies any vaginal complaints or pelvic pain.  CBC, CMP, lipase unremarkable.  Urinalysis without signs of infection.  Pregnancy test is negative.  CT of abdomen pelvis is negative for acute abnormality.  Suspect that her symptoms are viral in nature.  No evidence of appendicitis, cholecystitis, pyelonephritis or other emergent cause.  Will have her continue symptomatic treatment, increase hydration and return for worsening symptoms.  #2 complains of back pain after fall that occurred 1 week ago while at work.  She reports diffuse back pain.  On exam there is tenderness of the lumbar spine at the midline and tenderness at the paraspinal  musculature of the cervical and thoracic spine.  No midline tenderness in the cervical spine.  No changes sensation or weakness noted of bilateral upper and lower extremities.  Lumbar x-ray without any acute abnormalities. Suspect musculoskeletal cause of pain rather than cauda equina, fracture, dissection or other emergent cause. Will have her continue symptomatic treatment and add muscle relaxer prn.  Patient is hemodynamically stable, in NAD, and able to ambulate in the ED. Evaluation does not show pathology that would require ongoing emergent intervention or inpatient treatment. I have personally reviewed and interpreted all lab work and imaging at today's ED visit. I explained the diagnosis to the patient. Pain has been managed and has no complaints prior to discharge. Patient is comfortable with above plan and is stable for discharge at this time. All questions were answered prior to disposition. Strict return precautions for returning to the ED were discussed. Encouraged follow up with PCP.   An After Visit Summary was printed and given to the patient.   Portions of this note were generated with Scientist, clinical (histocompatibility and immunogenetics). Dictation errors may occur despite best attempts at proofreading.  Final Clinical Impression(s) / ED Diagnoses Final diagnoses:  Fall, initial encounter  Strain of lumbar region, initial encounter  Nausea vomiting and diarrhea    Rx / DC Orders ED Discharge Orders         Ordered    cyclobenzaprine (FLEXERIL) 10 MG tablet  2 times daily PRN     06/29/19 1728    ondansetron (ZOFRAN ODT) 4 MG disintegrating tablet  Every 8 hours PRN     06/29/19 1728  Dietrich Pates, PA-C 06/29/19 1734    Virgina Norfolk, DO 06/29/19 1809

## 2019-06-29 NOTE — Discharge Instructions (Addendum)
Take the Flexeril along with your other medications to help with your back pain. Take the Zofran as needed to help with your nausea. Slowly advance your diet as tolerated and drink plenty of fluids to prevent dehydration. Return to the ER for any worsening abdominal pain, fevers, losing control of your bowels or bladder, uncontrollable vomiting or chest pain.

## 2019-06-29 NOTE — ED Triage Notes (Signed)
Pt c/o lower abd pain, n/v/d day 3-also c/o pain to "whole back and neck" from a fall at work last week-NAD-steady gait

## 2021-03-18 ENCOUNTER — Other Ambulatory Visit: Payer: Self-pay | Admitting: Gastroenterology

## 2021-03-18 ENCOUNTER — Other Ambulatory Visit (HOSPITAL_COMMUNITY): Payer: Self-pay | Admitting: Gastroenterology

## 2021-03-18 DIAGNOSIS — R112 Nausea with vomiting, unspecified: Secondary | ICD-10-CM

## 2021-03-25 ENCOUNTER — Other Ambulatory Visit: Payer: Self-pay

## 2021-03-25 ENCOUNTER — Ambulatory Visit (HOSPITAL_COMMUNITY)
Admission: RE | Admit: 2021-03-25 | Discharge: 2021-03-25 | Disposition: A | Discharge status: Home / Self Care | Payer: 59 | Source: Ambulatory Visit | Attending: Gastroenterology | Admitting: Gastroenterology

## 2021-03-25 DIAGNOSIS — R112 Nausea with vomiting, unspecified: Secondary | ICD-10-CM | POA: Insufficient documentation

## 2021-03-25 MED ORDER — TECHNETIUM TC 99M MEBROFENIN IV KIT
4.8000 | PACK | Freq: Once | INTRAVENOUS | Status: AC | PRN
  Administered 2021-03-25: 11:00:00 4.8 via INTRAVENOUS

## 2021-05-09 DIAGNOSIS — F4310 Post-traumatic stress disorder, unspecified: Secondary | ICD-10-CM | POA: Diagnosis not present

## 2021-05-12 DIAGNOSIS — M47812 Spondylosis without myelopathy or radiculopathy, cervical region: Secondary | ICD-10-CM | POA: Diagnosis not present

## 2021-05-12 DIAGNOSIS — M4722 Other spondylosis with radiculopathy, cervical region: Secondary | ICD-10-CM | POA: Diagnosis not present

## 2021-07-14 IMAGING — CT CT ABD-PELV W/ CM
2 of 4 series · 16 of 46 positions shown, 18 images · IV contrast (Omnipaque)
Comparison: None.

CLINICAL DATA: Lower abdominal pain for 3 days. Fell at work last
week.

EXAM:
CT ABDOMEN AND PELVIS WITH CONTRAST
TECHNIQUE: Multidetector CT imaging of the abdomen and pelvis was performed
using the standard protocol following bolus administration of
intravenous contrast.
CONTRAST:  100mL OMNIPAQUE IOHEXOL 300 MG/ML  SOLN

[Series 2: axial st · axial · 0.72mm/px · z∈[-635,-225]mm · 13 of 90 slices shown, 15 images]
[im 4/90  soft-tissue]
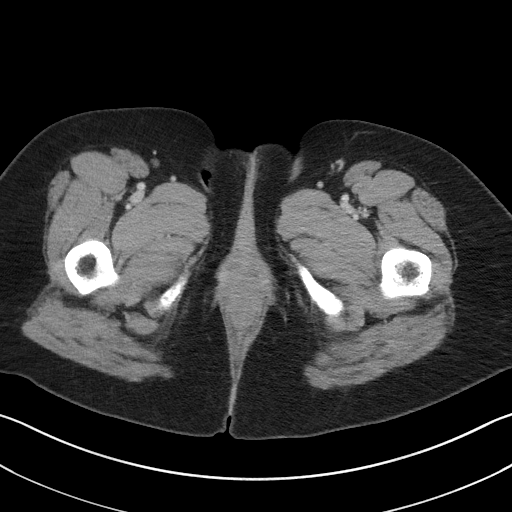
[im 4/90  bone]
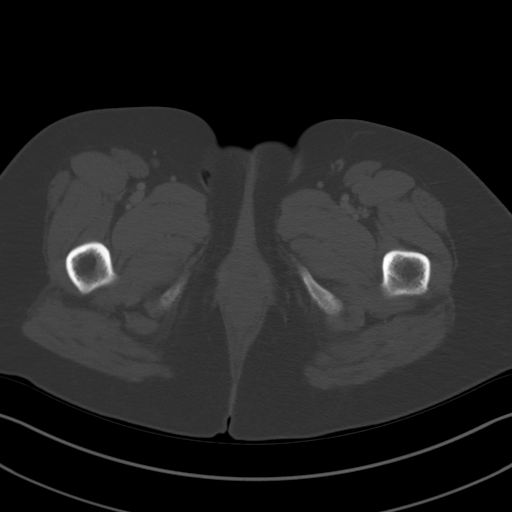
[im 12/90  soft-tissue]
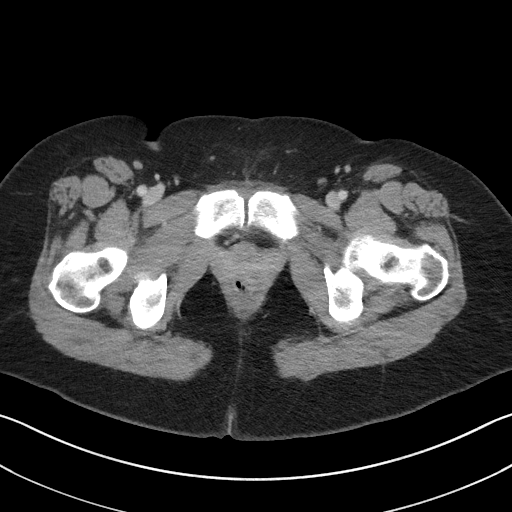
[im 19/90  soft-tissue]
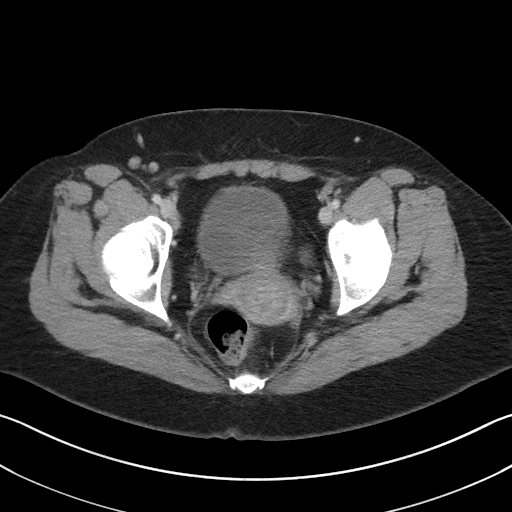
[im 26/90  soft-tissue]
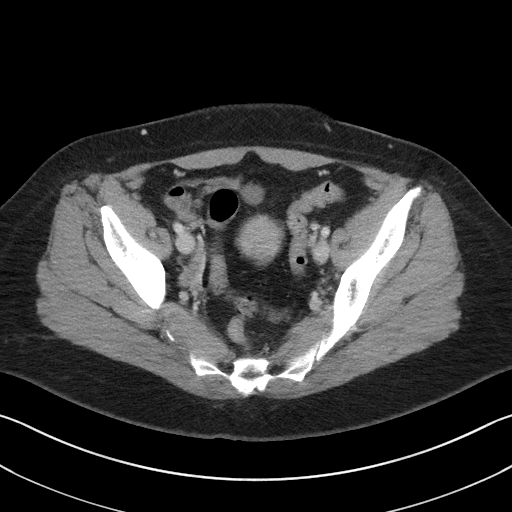
[im 30/90  soft-tissue]
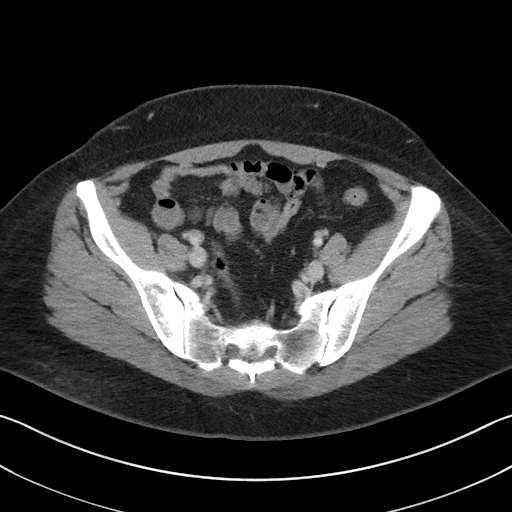
[im 38/90  soft-tissue]
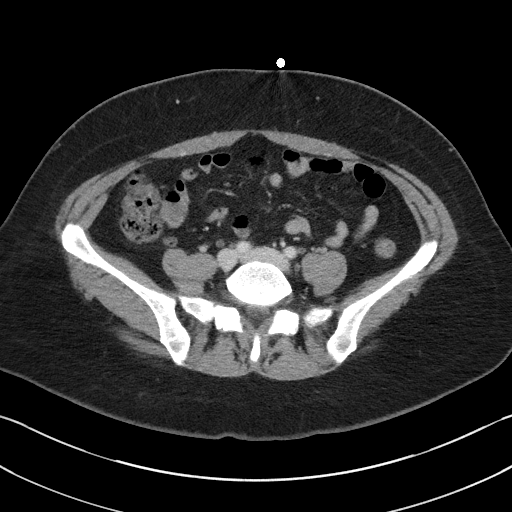
[im 45/90  soft-tissue]
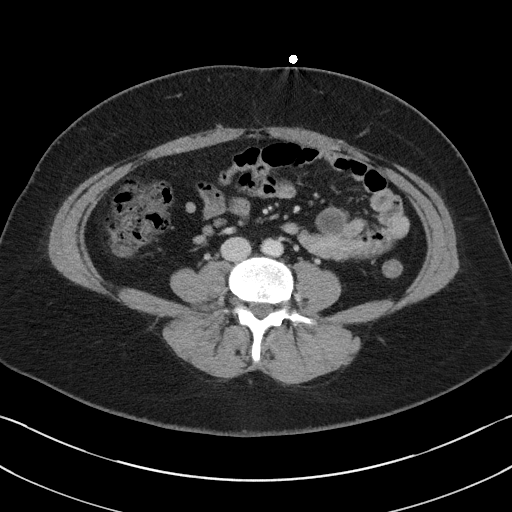
[im 52/90  soft-tissue]
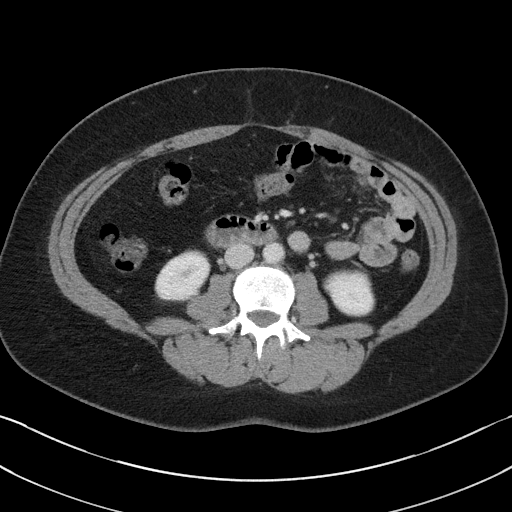
[im 60/90  soft-tissue]
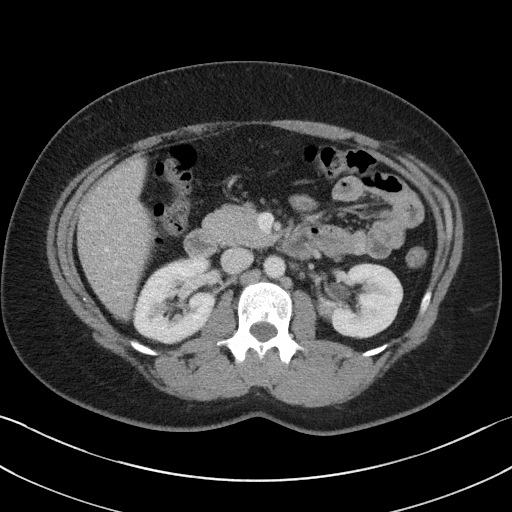
[im 60/90  bone]
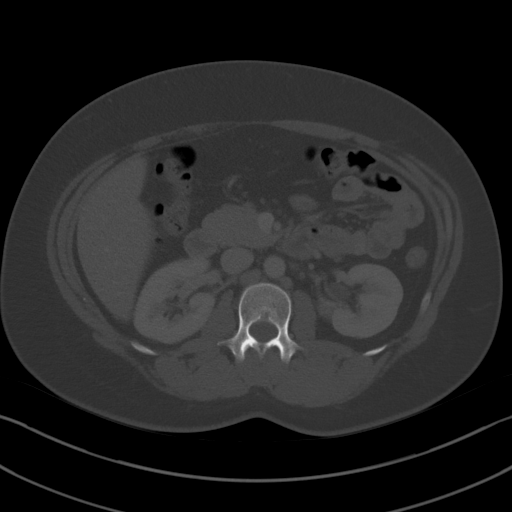
[im 64/90  soft-tissue]
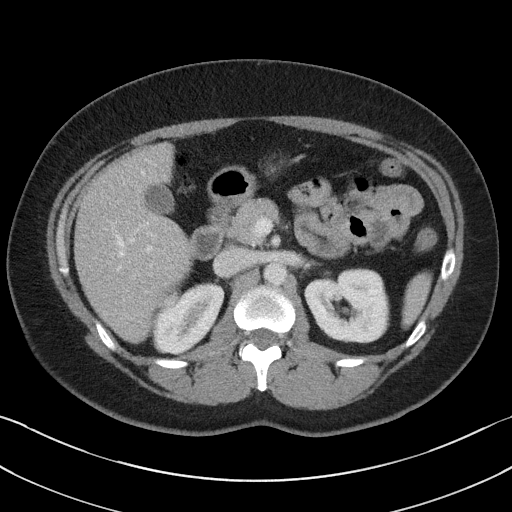
[im 71/90  soft-tissue]
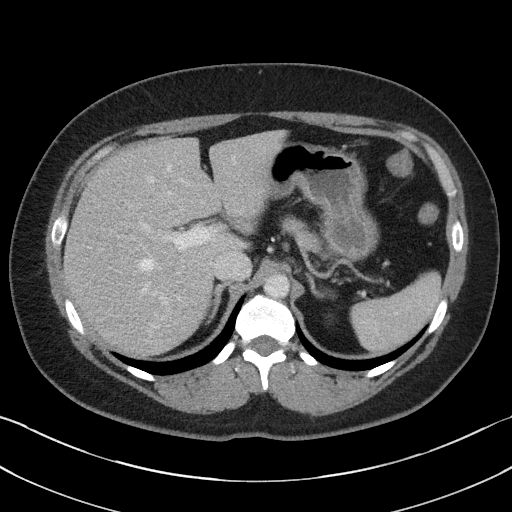
[im 78/90  soft-tissue]
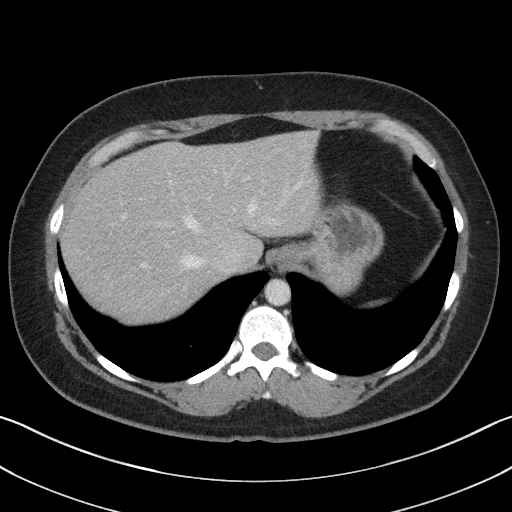
[im 86/90  soft-tissue]
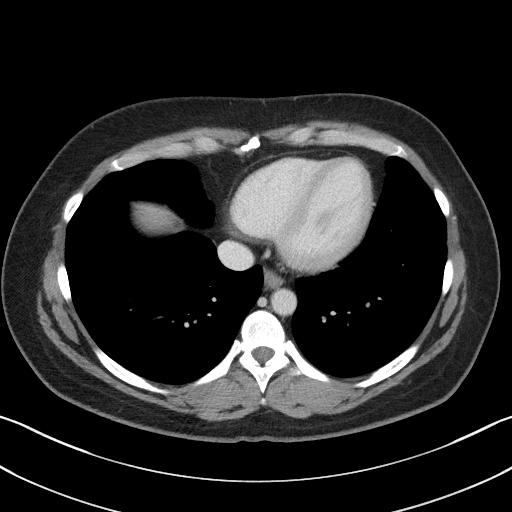

[Series 5: coronal st · coronal · 0.73mm/px · 3 of 101 slices shown]
[im 34/101  soft-tissue]
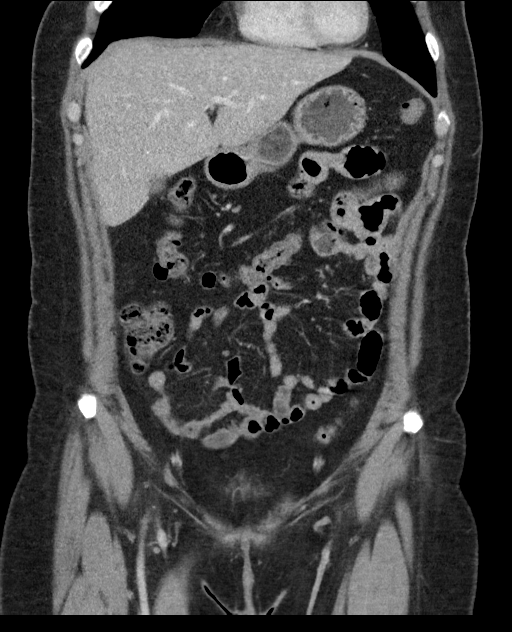
[im 45/101  soft-tissue]
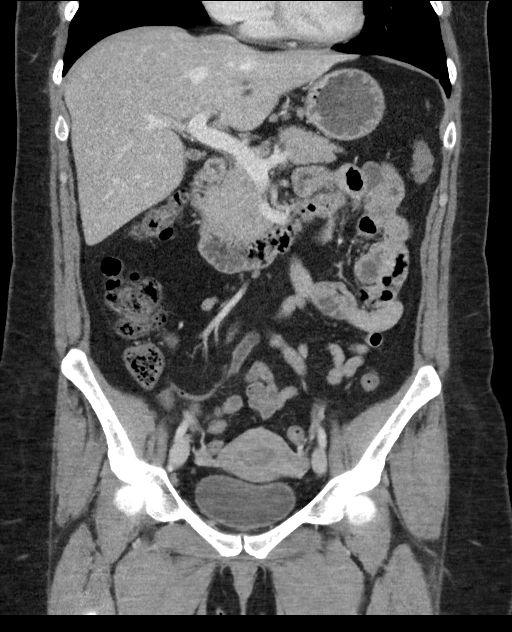
[im 56/101  soft-tissue]
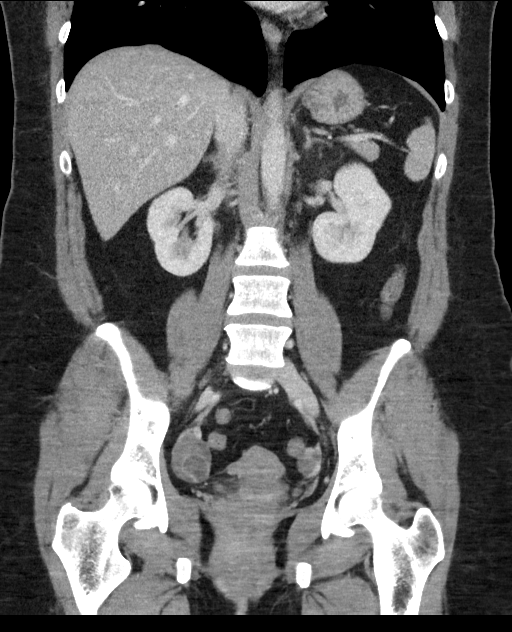

[16 of 46 positions shown; findings below may reference images not displayed]

FINDINGS: Lower chest: The lung bases are clear of acute process. No pleural
effusion or pulmonary lesions. The heart is normal in size. No
pericardial effusion. The distal esophagus and aorta are
unremarkable.

Hepatobiliary: No focal hepatic lesions or intrahepatic biliary
dilatation. The gallbladder is normal. No common bile duct
dilatation.

Pancreas: No mass, inflammation or ductal dilatation.

Spleen: Normal size. No focal lesions.

Adrenals/Urinary Tract: The adrenal glands and kidneys are
unremarkable. The bladder is normal.

Stomach/Bowel: The stomach, duodenum, small bowel and colon are
grossly normal without oral contrast. No acute inflammatory changes,
mass lesions or obstructive findings. The terminal ileum is normal.
The appendix is normal.

Vascular/Lymphatic: The aorta is normal in caliber. No dissection.
The branch vessels are patent. The major venous structures are
patent. No mesenteric or retroperitoneal mass or adenopathy. Small
scattered lymph nodes are noted.

Reproductive: The uterus and ovaries are normal. There may be some
type of birth control ring in the vagina.

Other: No pelvic mass or adenopathy. No free pelvic fluid
collections. No inguinal mass or adenopathy. No abdominal wall
hernia or subcutaneous lesions.

Musculoskeletal: No significant bony findings.
IMPRESSION: Unremarkable CT abdomen/pelvis. No acute abdominal/pelvic findings,
mass lesions or adenopathy.

## 2021-07-14 IMAGING — CR DG LUMBAR SPINE COMPLETE 4+V
5 series · 5 of 5 positions shown · non-contrast
Comparison: CT abdomen pelvis dated 06/29/2019.

CLINICAL DATA: 38-year-old female with fall back pain.

EXAM:
LUMBAR SPINE - COMPLETE 4+ VIEW

[t l-spine a.p.]
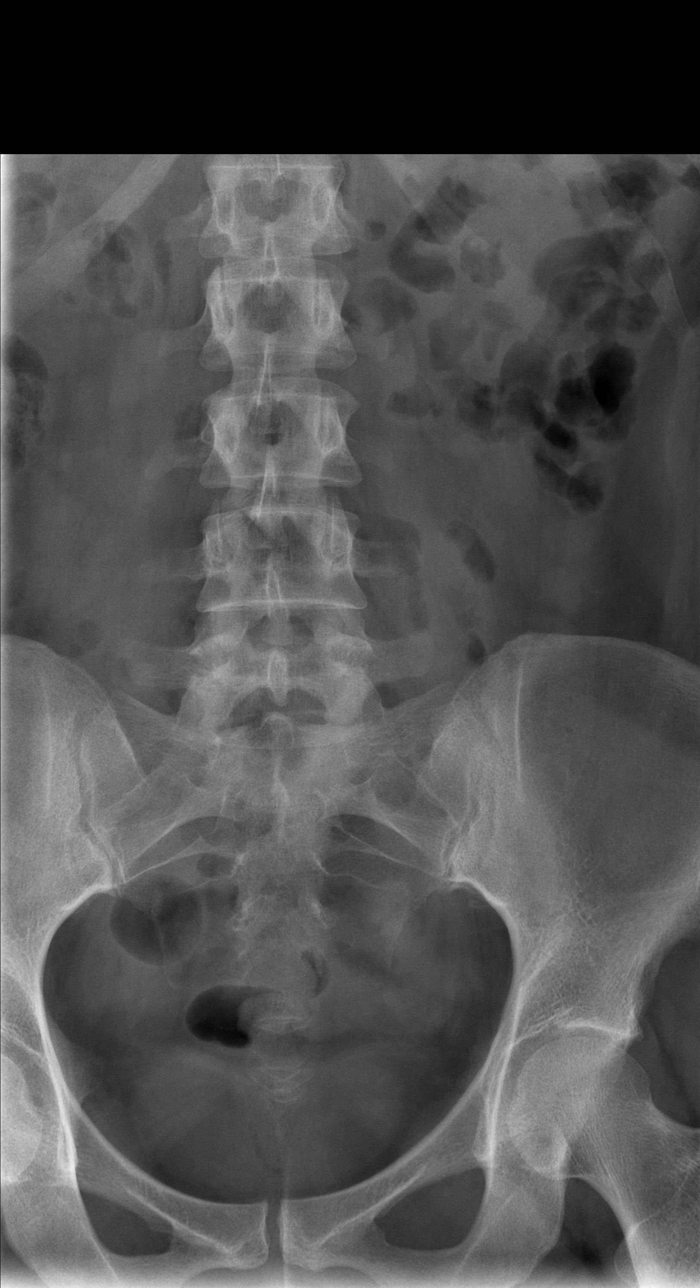

[t l-spine oblique exposure (1 of 2)]
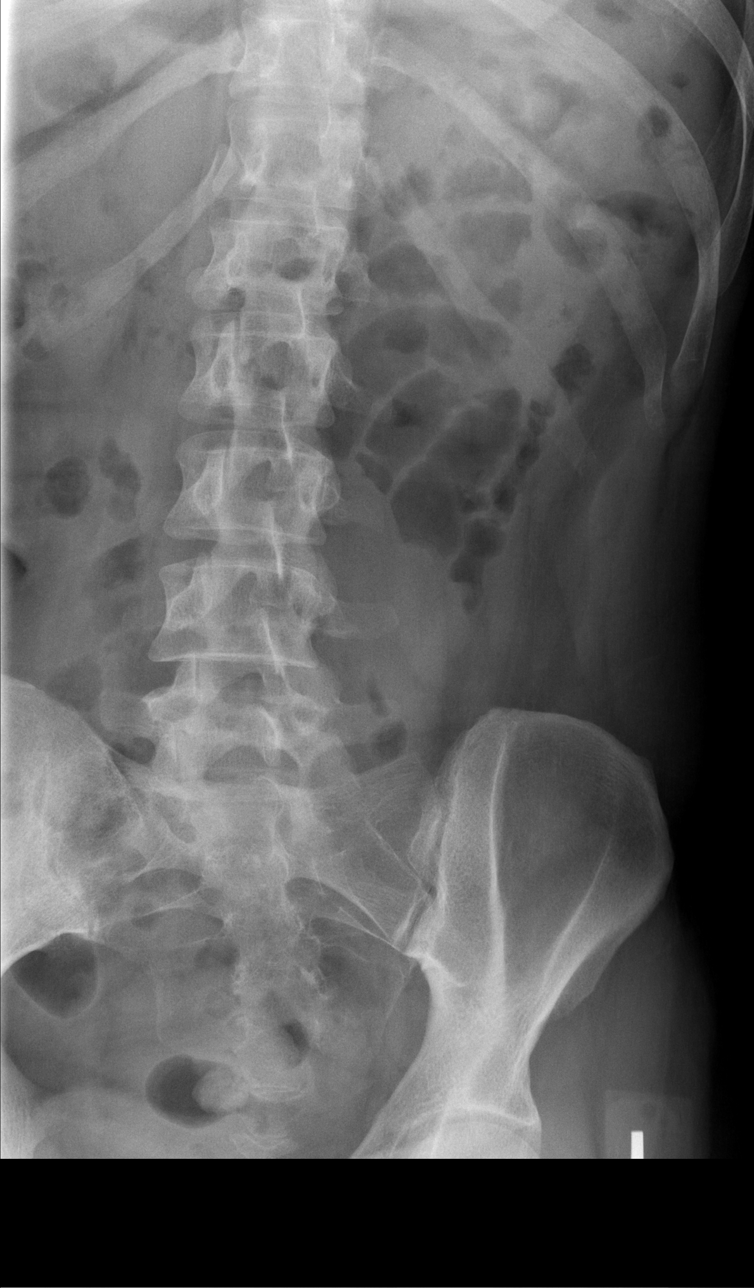

[t l-spine oblique exposure (2 of 2)]
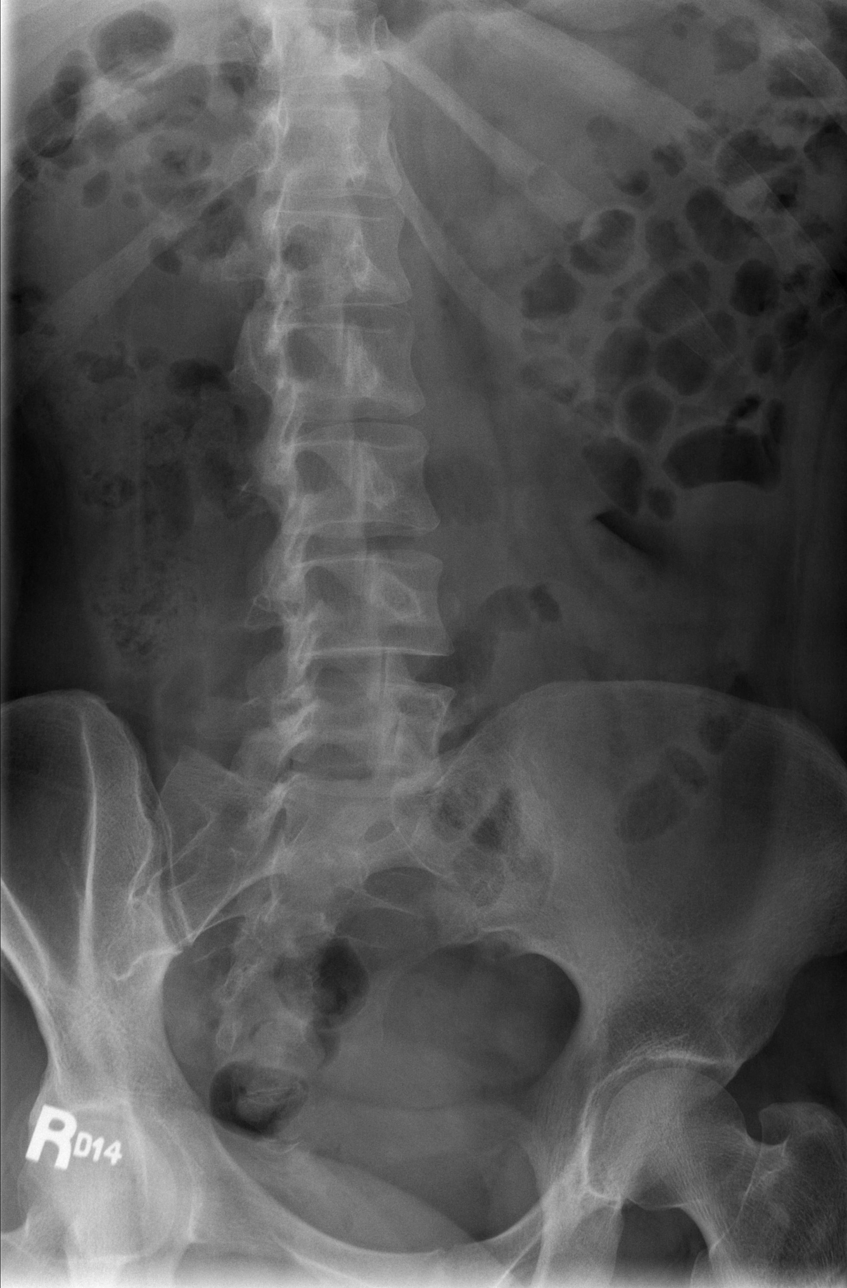

[t l-spine lat]
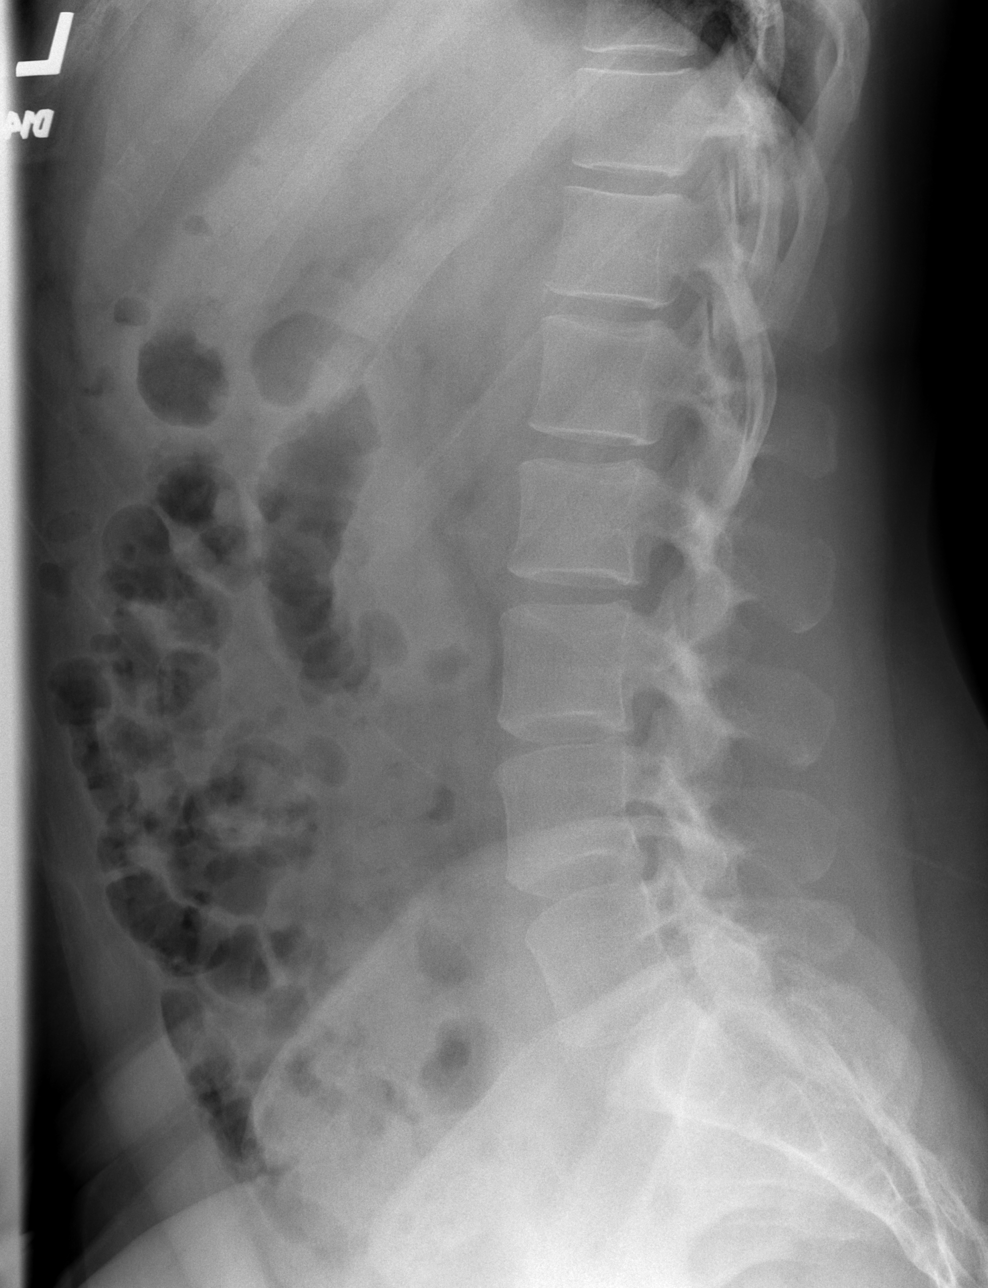

[t l-spine l5-s1 spot]
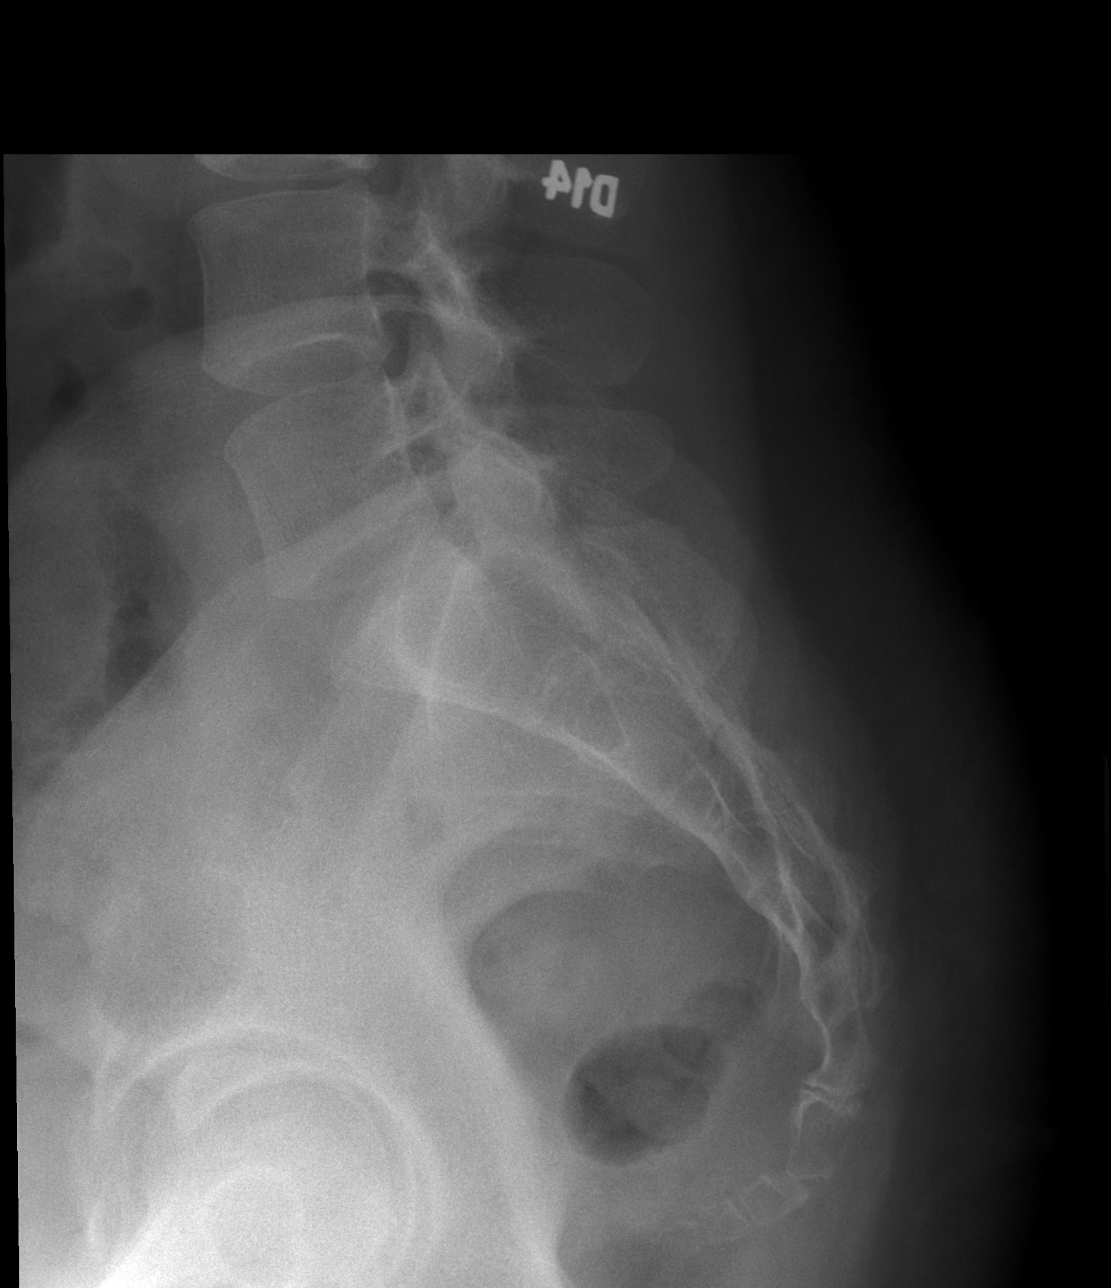

[5 of 5 positions shown; findings below may reference images not displayed]

FINDINGS: Five lumbar type vertebra. There is no acute fracture or subluxation
of the lumbar spine. The vertebral body heights and disc spaces are
maintained. The visualized posterior elements are intact. The soft
tissues are unremarkable.
IMPRESSION: Negative.

## 2021-08-12 DIAGNOSIS — Z1231 Encounter for screening mammogram for malignant neoplasm of breast: Secondary | ICD-10-CM | POA: Diagnosis not present

## 2021-08-12 DIAGNOSIS — Z113 Encounter for screening for infections with a predominantly sexual mode of transmission: Secondary | ICD-10-CM | POA: Diagnosis not present

## 2021-08-12 DIAGNOSIS — R102 Pelvic and perineal pain: Secondary | ICD-10-CM | POA: Diagnosis not present

## 2021-08-12 DIAGNOSIS — Z124 Encounter for screening for malignant neoplasm of cervix: Secondary | ICD-10-CM | POA: Diagnosis not present

## 2021-08-12 DIAGNOSIS — Z1159 Encounter for screening for other viral diseases: Secondary | ICD-10-CM | POA: Diagnosis not present

## 2021-08-12 DIAGNOSIS — Z118 Encounter for screening for other infectious and parasitic diseases: Secondary | ICD-10-CM | POA: Diagnosis not present

## 2021-08-12 DIAGNOSIS — Z6829 Body mass index (BMI) 29.0-29.9, adult: Secondary | ICD-10-CM | POA: Diagnosis not present

## 2021-08-12 DIAGNOSIS — Z114 Encounter for screening for human immunodeficiency virus [HIV]: Secondary | ICD-10-CM | POA: Diagnosis not present

## 2021-08-12 DIAGNOSIS — Z01419 Encounter for gynecological examination (general) (routine) without abnormal findings: Secondary | ICD-10-CM | POA: Diagnosis not present

## 2021-08-23 ENCOUNTER — Telehealth: Payer: Medicaid Other | Admitting: Physician Assistant

## 2021-08-23 DIAGNOSIS — R0981 Nasal congestion: Secondary | ICD-10-CM | POA: Diagnosis not present

## 2021-08-23 MED ORDER — AMOXICILLIN-POT CLAVULANATE 875-125 MG PO TABS: 1.0000 | ORAL_TABLET | Freq: Two times a day (BID) | ORAL | 0 refills | Status: DC

## 2021-08-23 NOTE — Progress Notes (Signed)
Virtual Visit Consent   [facility], you are scheduled for a virtual visit with a [place] provider today. Just as with appointments in the office, your consent must be obtained to participate. Your consent will be active for this visit and any virtual visit you may have with one of our providers in the next 365 days. If you have a MyChart account, a copy of this consent can be sent to you electronically.  As this is a virtual visit, video technology does not allow for your provider to perform a traditional examination. This may limit your provider's ability to fully assess your condition. If your provider identifies any concerns that need to be evaluated in person or the need to arrange testing (such as labs, EKG, etc.), we will make arrangements to do so. Although advances in technology are sophisticated, we cannot ensure that it will always work on either your end or our end. If the connection with a video visit is poor, the visit may have to be switched to a telephone visit. With either a video or telephone visit, we are not always able to ensure that we have a secure connection.  By engaging in this virtual visit, you consent to the provision of healthcare and authorize for your insurance to be billed (if applicable) for the services provided during this visit. Depending on your insurance coverage, you may receive a charge related to this service.  I need to obtain your verbal consent now. Are you willing to proceed with your visit today? Brandi Lopez has provided verbal consent on 08/23/2021 for a virtual visit (video or telephone). Jarold Motto, [place]  Date: 08/23/2021 4:03 PM  Virtual Visit via Video Note   I, Jarold Motto, connected with  [place]  ([id number], 06/18/1980) on 08/23/21 at  4:00 PM EDT by a video-enabled telemedicine application and verified that I am speaking with the correct person using two identifiers.  Location: Patient: Virtual Visit Location Patient:  Home Provider: Virtual Visit Location Provider: Home Office   I discussed the limitations of evaluation and management by telemedicine and the availability of in person appointments. The patient expressed understanding and agreed to proceed.    History of Present Illness: Brandi Lopez is a 41 y.o. who identifies as a female who was assigned female at birth, and is being seen today for URI symptoms.  Symptoms started last week. Muscle aches, sinus pressure, ear fullness, ear pain, fatigue. COVID test negative. Currently not pregnant or lactating. Some subjective fever. Denies: severe SOB, lower extremity swelling, hx of asthma, inability to keep fluids down  Has tried: nyquil, ricola, fluocinolone oil, flonase  Sx gradually worsening with time.   Problems: There are no problems to display for this patient.   Allergies: No Known Allergies Medications:  Current Outpatient Medications:    amoxicillin-clavulanate (AUGMENTIN) 875-125 MG tablet, Take 1 tablet by mouth 2 (two) times daily., Disp: 14 tablet, Rfl: 0   cyclobenzaprine (FLEXERIL) 10 MG tablet, Take 1 tablet (10 mg total) by mouth 2 (two) times daily as needed for muscle spasms., Disp: 20 tablet, Rfl: 0   fluticasone (FLONASE) 50 MCG/ACT nasal spray, Place 2 sprays into both nostrils daily., Disp: 1 g, Rfl: 0   gabapentin (NEURONTIN) 100 MG capsule, Take 1 capsule (100 mg total) by mouth 3 (three) times daily., Disp: 90 capsule, Rfl: 2   ibuprofen (ADVIL,MOTRIN) 600 MG tablet, Take 1 tablet (600 mg total) by mouth every 6 (six) hours as needed., Disp: 30 tablet, Rfl:  0   ipratropium (ATROVENT) 0.06 % nasal spray, Place 2 sprays into both nostrils 4 (four) times daily., Disp: 15 mL, Rfl: 0   LORazepam (ATIVAN) 0.5 MG tablet, Take 1 tablet (0.5 mg total) by mouth 3 (three) times daily as needed for anxiety., Disp: 4 tablet, Rfl: 0   meloxicam (MOBIC) 15 MG tablet, Take 1 tablet (15 mg total) by mouth daily. Take 1 daily with food., Disp:  30 tablet, Rfl: 0   ondansetron (ZOFRAN ODT) 4 MG disintegrating tablet, Take 1 tablet (4 mg total) by mouth every 8 (eight) hours as needed for nausea or vomiting., Disp: 4 tablet, Rfl: 0   ondansetron (ZOFRAN) 4 MG tablet, Take 1 tablet (4 mg total) by mouth every 6 (six) hours., Disp: 12 tablet, Rfl: 0   polyvinyl alcohol-povidone (REFRESH) 1.4-0.6 % ophthalmic solution, Place 1-2 drops into both eyes as needed. (Patient not taking: Reported on 07/01/2016), Disp: 30 mL, Rfl: 0   predniSONE (DELTASONE) 50 MG tablet, Take 1 tablet (50 mg total) by mouth daily., Disp: 5 tablet, Rfl: 0   Pregabalin (LYRICA PO), Take by mouth., Disp: , Rfl:    TIZANIDINE HCL PO, Take by mouth., Disp: , Rfl:    TRAMADOL HCL ER PO, Take by mouth., Disp: , Rfl:    zolpidem (AMBIEN) 10 MG tablet, Take 10 mg by mouth at bedtime as needed for sleep., Disp: , Rfl:   Observations/Objective: Patient is well-developed, well-nourished in no acute distress.  Resting comfortably at home.  Head is normocephalic, atraumatic.  No labored breathing.  Speech is clear and coherent with logical content.  Patient is alert and oriented at baseline.    Assessment and Plan: 1. Sinus congestion No red flags on discussion.  Will initiate augmentin per orders for presumed sinusitis. Discussed taking medications as prescribed. Reviewed return precautions including worsening fever, SOB, worsening cough or other concerns. Push fluids and rest. I recommend that patient follow-up if symptoms worsen or persist despite treatment x 7-10 days, sooner if needed.   Follow Up Instructions: I discussed the assessment and treatment plan with the patient. The patient was provided an opportunity to ask questions and all were answered. The patient agreed with the plan and demonstrated an understanding of the instructions.  A copy of instructions were sent to the patient via MyChart unless otherwise noted below.    The patient was advised to call back  or seek an in-person evaluation if the symptoms worsen or if the condition fails to improve as anticipated.  Time:  I spent 5-10 minutes with the patient via telehealth technology discussing the above problems/concerns.    Jarold Motto, [place]

## 2021-08-25 ENCOUNTER — Encounter: Payer: Self-pay | Admitting: Emergency Medicine

## 2021-08-25 ENCOUNTER — Ambulatory Visit
Admission: EM | Admit: 2021-08-25 | Discharge: 2021-08-25 | Disposition: A | Discharge status: Home / Self Care | Payer: Medicaid Other

## 2021-08-25 DIAGNOSIS — F17200 Nicotine dependence, unspecified, uncomplicated: Secondary | ICD-10-CM

## 2021-08-25 DIAGNOSIS — J069 Acute upper respiratory infection, unspecified: Secondary | ICD-10-CM

## 2021-08-25 DIAGNOSIS — J309 Allergic rhinitis, unspecified: Secondary | ICD-10-CM | POA: Diagnosis not present

## 2021-08-25 MED ORDER — PREDNISONE 50 MG PO TABS: 50.0000 mg | ORAL_TABLET | Freq: Every day | ORAL | 0 refills | Status: DC

## 2021-08-25 NOTE — ED Provider Notes (Addendum)
[facility]   MRN: [id number] DOB: 01-Jul-1980  Subjective:   Brandi Lopez is a 41 y.o. female presenting for 1 week history of sinus congestion, ear pain, drainage and cough.  Patient did a video visit on Saturday and was prescribed oral antibiotics, Augmentin specifically, at 5 days of symptoms.  Patient has been taking medication and is not worse especially with her ears.  Has had bilateral ear ringing and fullness, ear pain worse over the right side.  She has a history of allergic rhinitis and is taking Xyzal and Flonase consistently.  She is also used over-the-counter medications without much relief.  No chest pain, shortness of breath or wheezing.  Patient is a smoker, does 1/2 ppd.   No current facility-administered medications for this encounter.  Current Outpatient Medications:    amoxicillin-clavulanate (AUGMENTIN) 875-125 MG tablet, Take 1 tablet by mouth 2 (two) times daily., Disp: 14 tablet, Rfl: 0   cyclobenzaprine (FLEXERIL) 10 MG tablet, Take 1 tablet (10 mg total) by mouth 2 (two) times daily as needed for muscle spasms., Disp: 20 tablet, Rfl: 0   gabapentin (NEURONTIN) 100 MG capsule, Take 1 capsule (100 mg total) by mouth 3 (three) times daily., Disp: 90 capsule, Rfl: 2   ibuprofen (ADVIL,MOTRIN) 600 MG tablet, Take 1 tablet (600 mg total) by mouth every 6 (six) hours as needed., Disp: 30 tablet, Rfl: 0   LORazepam (ATIVAN) 0.5 MG tablet, Take 1 tablet (0.5 mg total) by mouth 3 (three) times daily as needed for anxiety., Disp: 4 tablet, Rfl: 0   meloxicam (MOBIC) 15 MG tablet, Take 1 tablet (15 mg total) by mouth daily. Take 1 daily with food., Disp: 30 tablet, Rfl: 0   ondansetron (ZOFRAN ODT) 4 MG disintegrating tablet, Take 1 tablet (4 mg total) by mouth every 8 (eight) hours as needed for nausea or vomiting., Disp: 4 tablet, Rfl: 0   ondansetron (ZOFRAN) 4 MG tablet, Take 1 tablet (4 mg total) by mouth every 6 (six) hours., Disp: 12 tablet, Rfl: 0    polyvinyl alcohol-povidone (REFRESH) 1.4-0.6 % ophthalmic solution, Place 1-2 drops into both eyes as needed. (Patient not taking: Reported on 07/01/2016), Disp: 30 mL, Rfl: 0   predniSONE (DELTASONE) 50 MG tablet, Take 1 tablet (50 mg total) by mouth daily., Disp: 5 tablet, Rfl: 0   Pregabalin (LYRICA PO), Take by mouth., Disp: , Rfl:    TIZANIDINE HCL PO, Take by mouth., Disp: , Rfl:    TRAMADOL HCL ER PO, Take by mouth., Disp: , Rfl:    zolpidem (AMBIEN) 10 MG tablet, Take 10 mg by mouth at bedtime as needed for sleep., Disp: , Rfl:    No Known Allergies  Past Medical History:  Diagnosis Date   Anxiety    Herniated cervical disc    Hyperlipemia    Neck pain, chronic      Past Surgical History:  Procedure Laterality Date   CERVICAL DISC SURGERY     NOSE SURGERY      Family History  Problem Relation Age of Onset   Diabetes Mother    Hypertension Mother    Diabetes Father    Hypertension Father     Social History   Tobacco Use   Smoking status: Former    Types: Cigarettes    Quit date: 2022    Years since quitting: 1.3   Smokeless tobacco: Never  Vaping Use   Vaping Use: Never used  Substance Use Topics   Alcohol  use: No   Drug use: Yes    Types: Marijuana    ROS   Objective:   Vitals: BP 123/88   Pulse (!) 105   Resp 20   Physical Exam Constitutional:      General: She is not in acute distress.    Appearance: Normal appearance. She is well-developed and normal weight. She is not ill-appearing, toxic-appearing or diaphoretic.  HENT:     Head: Normocephalic and atraumatic.     Right Ear: Ear canal and external ear normal. No drainage or tenderness. No middle ear effusion. There is no impacted cerumen. Tympanic membrane is not erythematous.     Left Ear: Ear canal and external ear normal. No drainage or tenderness.  No middle ear effusion. There is no impacted cerumen. Tympanic membrane is not erythematous.     Ears:     Comments: Mild air-fluid level of  both TMs bilaterally.    Nose: Congestion present. No rhinorrhea.     Mouth/Throat:     Mouth: Mucous membranes are moist. No oral lesions.     Pharynx: No pharyngeal swelling, oropharyngeal exudate, posterior oropharyngeal erythema or uvula swelling.     Tonsils: No tonsillar exudate or tonsillar abscesses.  Eyes:     General: No scleral icterus.       Right eye: No discharge.        Left eye: No discharge.     Extraocular Movements: Extraocular movements intact.     Right eye: Normal extraocular motion.     Left eye: Normal extraocular motion.     Conjunctiva/sclera: Conjunctivae normal.  Cardiovascular:     Rate and Rhythm: Normal rate and regular rhythm.     Heart sounds: Normal heart sounds. No murmur heard.   No friction rub. No gallop.  Pulmonary:     Effort: Pulmonary effort is normal. No respiratory distress.     Breath sounds: No stridor. No wheezing, rhonchi or rales.  Chest:     Chest wall: No tenderness.  Musculoskeletal:     Cervical back: Normal range of motion and neck supple.  Lymphadenopathy:     Cervical: No cervical adenopathy.  Skin:    General: Skin is warm and dry.  Neurological:     General: No focal deficit present.     Mental Status: She is alert and oriented to person, place, and time.  Psychiatric:        Mood and Affect: Mood normal.        Behavior: Behavior normal.    Assessment and Plan :   PDMP not reviewed this encounter.  1. Viral upper respiratory illness   2. Allergic rhinitis, unspecified seasonality, unspecified trigger   3. Smoker    Unfortunately patient was prescribed antibiotics very early in her illness and has not responded to this likely because she has a viral infection made worse by her allergic rhinitis and smoking now causing a secondary eustachian tube dysfunction.  Recommended patient use an oral prednisone course instead to help with the aforementioned issues.  Use supportive care otherwise. Deferred imaging given clear  cardiopulmonary exam, hemodynamically stable vital signs. Counseled patient on potential for adverse effects with medications prescribed/recommended today, ER and return-to-clinic precautions discussed, patient verbalized understanding.     Wallis Bamberg, PA-C 08/25/21 1708

## 2021-08-25 NOTE — ED Triage Notes (Signed)
Pt here with bilateral ear pain and fullness following a URI 4 days ago. Pt has been on Augmentin since Saturday and ear pain has gotten progressively worse. Pt also reports tick bite behind left ear that was only attached for less than an hour.

## 2021-09-23 DIAGNOSIS — M4322 Fusion of spine, cervical region: Secondary | ICD-10-CM | POA: Diagnosis not present

## 2021-09-23 DIAGNOSIS — M5412 Radiculopathy, cervical region: Secondary | ICD-10-CM | POA: Diagnosis not present

## 2021-09-30 DIAGNOSIS — Z6827 Body mass index (BMI) 27.0-27.9, adult: Secondary | ICD-10-CM | POA: Diagnosis not present

## 2021-09-30 DIAGNOSIS — Z3202 Encounter for pregnancy test, result negative: Secondary | ICD-10-CM | POA: Diagnosis not present

## 2021-09-30 DIAGNOSIS — M79644 Pain in right finger(s): Secondary | ICD-10-CM | POA: Diagnosis not present

## 2021-09-30 DIAGNOSIS — R030 Elevated blood-pressure reading, without diagnosis of hypertension: Secondary | ICD-10-CM | POA: Diagnosis not present

## 2021-09-30 DIAGNOSIS — Z79899 Other long term (current) drug therapy: Secondary | ICD-10-CM | POA: Diagnosis not present

## 2021-09-30 DIAGNOSIS — G4700 Insomnia, unspecified: Secondary | ICD-10-CM | POA: Diagnosis not present

## 2021-09-30 DIAGNOSIS — R7303 Prediabetes: Secondary | ICD-10-CM | POA: Diagnosis not present

## 2022-01-13 DIAGNOSIS — F411 Generalized anxiety disorder: Secondary | ICD-10-CM | POA: Diagnosis not present

## 2022-01-14 ENCOUNTER — Encounter: Payer: Self-pay | Admitting: Student

## 2022-01-14 ENCOUNTER — Ambulatory Visit (INDEPENDENT_AMBULATORY_CARE_PROVIDER_SITE_OTHER): Payer: BC Managed Care – PPO | Admitting: Student

## 2022-01-14 ENCOUNTER — Other Ambulatory Visit: Payer: Self-pay | Admitting: Student

## 2022-01-14 ENCOUNTER — Ambulatory Visit: Payer: Medicaid Other | Admitting: Family Medicine

## 2022-01-14 VITALS — BP 102/64 | HR 88 | Ht 64.0 in | Wt 156.0 lb

## 2022-01-14 DIAGNOSIS — G4700 Insomnia, unspecified: Secondary | ICD-10-CM

## 2022-01-14 DIAGNOSIS — M47812 Spondylosis without myelopathy or radiculopathy, cervical region: Secondary | ICD-10-CM

## 2022-01-14 DIAGNOSIS — Z760 Encounter for issue of repeat prescription: Secondary | ICD-10-CM | POA: Diagnosis not present

## 2022-01-14 MED ORDER — ESZOPICLONE 3 MG PO TABS: 3.0000 mg | ORAL_TABLET | Freq: Every day | ORAL | 0 refills | Status: DC

## 2022-01-14 MED ORDER — LEVOCETIRIZINE DIHYDROCHLORIDE 5 MG PO TABS: 5.0000 mg | ORAL_TABLET | Freq: Every evening | ORAL | 0 refills | Status: DC

## 2022-01-14 MED ORDER — TRAMADOL HCL 50 MG PO TABS: 100.0000 mg | ORAL_TABLET | Freq: Every day | ORAL | 0 refills | Status: DC

## 2022-01-14 MED ORDER — ARMODAFINIL 200 MG PO TABS: 200.0000 mg | ORAL_TABLET | Freq: Every day | ORAL | 0 refills | Status: DC

## 2022-01-14 NOTE — Patient Instructions (Addendum)
Great to meet you today.  I refilled your medications today.  Please schedule an appointment with Dr. Nori Riis in 1 month.  Dr. Owens Shark

## 2022-01-14 NOTE — Progress Notes (Signed)
New Patient Office Visit  Subjective    Patient ID: Brandi Lopez, female    DOB: [birth year]  Age: 41 y.o. MRN: [id number]  CC:  Chief Complaint  Patient presents with   Medication Refill    HPI [facility] presents for medication refill.  She is to establish care here with Dr. Nori Riis, and today we focused on little bit of history taking as well as medication reconciliation.    Medical history: - History of cervical spondylosis, s/p cervical fusion which Brandi Lopez says cause worsening pain for her.  She is now participating in cervical epidural steroid injections to help with her pain. - She is in psychotherapy, which has been very beneficial for her.  She recognizes the mind-body connection.  She has been working on weaning off of the medicines she was on in the past including Ativan, which she no longer takes. -History of IBS  Family history: - Father had a CABG x73 at 70 years of age.  T - Familial hypercholesterolemia. Brandi Lopez says that her lipids are always elevated.  She already is seeing a cardiologist. - Mom also has an ataxia disorder  HCM -Pap smear 2023, normal per patient -Mammogram completed this year, also normal   Outpatient Encounter Medications as of 01/14/2022  Medication Sig   Armodafinil 200 MG TABS Take 200 mg by mouth daily.   eszopiclone 3 MG TABS Take 1 tablet (3 mg total) by mouth at bedtime. Take immediately before bedtime   traMADol (ULTRAM) 50 MG tablet Take 2 tablets (100 mg total) by mouth daily.   fluticasone (FLONASE) 50 MCG/ACT nasal spray Place into both nostrils daily.   levocetirizine (XYZAL) 5 MG tablet Take 1 tablet (5 mg total) by mouth every evening.   Pregabalin (LYRICA PO) Take by mouth.   TIZANIDINE HCL PO Take by mouth.   [DISCONTINUED] amoxicillin-clavulanate (AUGMENTIN) 875-125 MG tablet Take 1 tablet by mouth 2 (two) times daily.   [DISCONTINUED] cyclobenzaprine (FLEXERIL) 10 MG tablet Take 1 tablet (10 mg total) by mouth 2 (two) times  daily as needed for muscle spasms.   [DISCONTINUED] gabapentin (NEURONTIN) 100 MG capsule Take 1 capsule (100 mg total) by mouth 3 (three) times daily.   [DISCONTINUED] ibuprofen (ADVIL,MOTRIN) 600 MG tablet Take 1 tablet (600 mg total) by mouth every 6 (six) hours as needed.   [DISCONTINUED] levocetirizine (XYZAL) 5 MG tablet Take 5 mg by mouth every evening.   [DISCONTINUED] LORazepam (ATIVAN) 0.5 MG tablet Take 1 tablet (0.5 mg total) by mouth 3 (three) times daily as needed for anxiety.   [DISCONTINUED] meloxicam (MOBIC) 15 MG tablet Take 1 tablet (15 mg total) by mouth daily. Take 1 daily with food.   [DISCONTINUED] ondansetron (ZOFRAN ODT) 4 MG disintegrating tablet Take 1 tablet (4 mg total) by mouth every 8 (eight) hours as needed for nausea or vomiting.   [DISCONTINUED] ondansetron (ZOFRAN) 4 MG tablet Take 1 tablet (4 mg total) by mouth every 6 (six) hours.   [DISCONTINUED] polyvinyl alcohol-povidone (REFRESH) 1.4-0.6 % ophthalmic solution Place 1-2 drops into both eyes as needed. (Patient not taking: Reported on 07/01/2016)   [DISCONTINUED] predniSONE (DELTASONE) 50 MG tablet Take 1 tablet (50 mg total) by mouth daily with breakfast.   [DISCONTINUED] TRAMADOL HCL ER PO Take by mouth.   [DISCONTINUED] zolpidem (AMBIEN) 10 MG tablet Take 10 mg by mouth at bedtime as needed for sleep.   No facility-administered encounter medications on file as of 01/14/2022.    Past Medical History:  Diagnosis  Date   Anxiety    Herniated cervical disc    Hyperlipemia    Neck pain, chronic     Past Surgical History:  Procedure Laterality Date   CERVICAL DISC SURGERY     NOSE SURGERY      Family History  Problem Relation Age of Onset   Diabetes Mother    Hypertension Mother    Diabetes Father    Hypertension Father     Social History   Socioeconomic History   Marital status: Single    Spouse name: Not on file   Number of children: Not on file   Years of education: Not on file    Highest education level: Not on file  Occupational History   Not on file  Tobacco Use   Smoking status: Former    Types: Cigarettes    Quit date: 2022    Years since quitting: 1.7    Passive exposure: Past   Smokeless tobacco: Never  Vaping Use   Vaping Use: Never used  Substance and Sexual Activity   Alcohol use: No   Drug use: Yes    Types: Marijuana   Sexual activity: Not on file  Other Topics Concern   Not on file  Social History Narrative   Not on file   Social Determinants of Health   Financial Resource Strain: Not on file  Food Insecurity: Not on file  Transportation Needs: Not on file  Physical Activity: Not on file  Stress: Not on file  Social Connections: Not on file  Intimate Partner Violence: Not on file    Review of Systems  Constitutional:  Negative for chills and fever.  Gastrointestinal:  Negative for nausea and vomiting.  Psychiatric/Behavioral:  Negative for depression.    Objective    BP 102/64   Pulse 88   Ht 5\' 4"  (1.626 m)   Wt 156 lb (70.8 kg)   LMP 01/11/2022   SpO2 98%   BMI 26.78 kg/m   Physical Exam General: Well-appearing, no acute distress, very pleasant CV: Regular rate and rhythm Respiratory: Normal work of breathing on room air.  No wheezing, crackles, or rhonchi.  Lungs with normal clear sounds throughout Psych: Normal mood and affect.  Makes good eye contact. Neuro: Cranial nerves grossly intact.  Normal speech.   Assessment & Plan:   Brandi Lopez is a very pleasant 41 year old here to the establish care, which will be completed when she meets Dr. 41 who will be her PCP.  Today we did go through her medication reconciliation, and I have refilled her tramadol 100 mg once a day, armodafinil 200 mg daily, Xyzal 5 mg daily, and eszopiclone 3 mg daily for 1 month.  PDMP reviewed and refills are appropriate. She has been working on getting off of sedating medications.  I believe that there is some more work we could do here.   Although, today was just our initial visit. Follow-up with PCP on 02/11/2022.   13/11/2021, DO

## 2022-01-15 ENCOUNTER — Telehealth: Payer: Self-pay

## 2022-01-15 NOTE — Telephone Encounter (Signed)
Patient calls nurse line regarding issues with picking up medications that were prescribed yesterday. She was told by the pharmacy that there was an issue with provider DEA. Called pharmacy. Pharmacist updated provider to include entire DEA, as they were originally only running with hospital DEA.   They were able to run medications through and are working on getting them ready for the patient. Attempted to call patient with update. Patient did not answer, unable to LVM. If patient calls back, please let her know that issue has been resolved and that pharmacy will reach out once they are ready for pick up.   Talbot Grumbling, RN

## 2022-01-21 DIAGNOSIS — M4722 Other spondylosis with radiculopathy, cervical region: Secondary | ICD-10-CM | POA: Diagnosis not present

## 2022-01-21 DIAGNOSIS — Z981 Arthrodesis status: Secondary | ICD-10-CM | POA: Diagnosis not present

## 2022-01-27 DIAGNOSIS — M47812 Spondylosis without myelopathy or radiculopathy, cervical region: Secondary | ICD-10-CM | POA: Diagnosis not present

## 2022-01-27 DIAGNOSIS — R519 Headache, unspecified: Secondary | ICD-10-CM | POA: Diagnosis not present

## 2022-02-10 ENCOUNTER — Other Ambulatory Visit: Payer: Self-pay

## 2022-02-10 NOTE — Telephone Encounter (Signed)
Patient calls nurse line requesting medication refills. She was seen by Dr. Owens Shark on 10/11 and it says that she will be following up with Dr. Nori Riis as her PCP.   Patient is unable to see Dr. Nori Riis until 11/15, however, will be out of medication prior to this appointment.   Forwarding to Dr. Owens Shark and Dr. Nori Riis (unsure who will be refilling at this point)  Thanks.   Talbot Grumbling, RN

## 2022-02-11 ENCOUNTER — Ambulatory Visit: Payer: Medicaid Other | Admitting: Family Medicine

## 2022-02-11 ENCOUNTER — Other Ambulatory Visit: Payer: Self-pay | Admitting: Student

## 2022-02-11 MED ORDER — ESZOPICLONE 3 MG PO TABS: 3.0000 mg | ORAL_TABLET | Freq: Every day | ORAL | 0 refills | Status: DC

## 2022-02-11 MED ORDER — TRAMADOL HCL 50 MG PO TABS: 100.0000 mg | ORAL_TABLET | Freq: Every day | ORAL | 0 refills | Status: DC

## 2022-02-11 MED ORDER — ARMODAFINIL 200 MG PO TABS: 200.0000 mg | ORAL_TABLET | Freq: Every day | ORAL | 0 refills | Status: DC

## 2022-02-11 MED ORDER — LEVOCETIRIZINE DIHYDROCHLORIDE 5 MG PO TABS: 5.0000 mg | ORAL_TABLET | Freq: Every evening | ORAL | 0 refills | Status: DC

## 2022-02-18 ENCOUNTER — Ambulatory Visit (INDEPENDENT_AMBULATORY_CARE_PROVIDER_SITE_OTHER): Payer: BC Managed Care – PPO | Admitting: Family Medicine

## 2022-02-18 ENCOUNTER — Encounter: Payer: Self-pay | Admitting: Family Medicine

## 2022-02-18 VITALS — BP 104/62 | HR 84 | Ht 64.0 in | Wt 153.0 lb

## 2022-02-18 DIAGNOSIS — M47812 Spondylosis without myelopathy or radiculopathy, cervical region: Secondary | ICD-10-CM

## 2022-02-18 DIAGNOSIS — G479 Sleep disorder, unspecified: Secondary | ICD-10-CM

## 2022-02-18 DIAGNOSIS — G4700 Insomnia, unspecified: Secondary | ICD-10-CM

## 2022-02-19 DIAGNOSIS — G479 Sleep disorder, unspecified: Secondary | ICD-10-CM | POA: Insufficient documentation

## 2022-02-19 DIAGNOSIS — M47812 Spondylosis without myelopathy or radiculopathy, cervical region: Secondary | ICD-10-CM | POA: Insufficient documentation

## 2022-02-19 DIAGNOSIS — G4700 Insomnia, unspecified: Secondary | ICD-10-CM | POA: Insufficient documentation

## 2022-02-19 NOTE — Assessment & Plan Note (Signed)
We discussed these issues.  She plans to follow-up soon with her new neurosurgeon, Dr. Mariann Laster at [facility].

## 2022-02-19 NOTE — Progress Notes (Signed)
    CHIEF COMPLAINT / HPI: New patient to my practice.  Has been previously on medications for insomnia and narcolepsy.  Had a sleep study done 2019 which evidently showed some abnormalities and was started on these medications at that time.  Does not take the armodafinil regularly.  Says she splits it in half and takes 1-3 times a week when her narcolepsy gets really bad.  She does use the sleeping pill at night as well as the muscle relaxers. 2.  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  Has been on several medications for this.  Is planning to go back and have a revision of the surgery sometime in the future.   PERTINENT  PMH / PSH: I have reviewed the patient's medications, allergies, past medical and surgical history, smoking status and updated in the EMR as appropriate.   OBJECTIVE:  BP 104/62   Pulse 84   Ht 5\' 4"  (1.626 m)   Wt 153 lb (69.4 kg)   LMP 02/12/2022   SpO2 98%   BMI 26.26 kg/m   Vital signs reviewed. GENERAL: Well-developed, well-nourished, no acute distress. CARDIOVASCULAR: Regular rate and rhythm no murmur gallop or rub LUNGS: Clear to auscultation bilaterally, no rales or wheeze. ABDOMEN: Soft positive bowel sounds NEURO: No gross focal neurological deficits. MSK: Movement of extremity x 4.  ASSESSMENT / PLAN:   Sleep disorder I can see the report from a sleep study in 2019 but nothing since then.  Sounds like she is Very Complex Issues Going on.  I Am Not Really Comfortable with the Current Medication Regimen and We Discussed.  She Agrees to Be Seen by Neurology for Updated Evaluation of Sleep Disorders, Potential Narcolepsy, Potential Other Sleep Issues.  Referral Be Placed and I Will See Her Back after She Is Seeing the Neurologist.  Cervical spondylosis We discussed these issues.  She plans to follow-up soon with her new neurosurgeon, Dr. 2020 at [facility].   [facility] MD

## 2022-02-19 NOTE — Assessment & Plan Note (Signed)
I can see the report from a sleep study in 2019 but nothing since then.  Sounds like she is Very Complex Issues Going on.  I Am Not Really Comfortable with the Current Medication Regimen and We Discussed.  She Agrees to Be Seen by Neurology for Updated Evaluation of Sleep Disorders, Potential Narcolepsy, Potential Other Sleep Issues.  Referral Be Placed and I Will See Her Back after She Is Seeing the Neurologist.

## 2022-04-17 ENCOUNTER — Other Ambulatory Visit: Payer: Self-pay | Admitting: Student

## 2022-05-15 ENCOUNTER — Other Ambulatory Visit: Payer: Self-pay

## 2022-05-15 MED ORDER — ESZOPICLONE 3 MG PO TABS: 3.0000 mg | ORAL_TABLET | Freq: Every day | ORAL | 0 refills | Status: DC

## 2022-07-08 ENCOUNTER — Other Ambulatory Visit: Payer: Self-pay | Admitting: Family Medicine

## 2022-07-28 DIAGNOSIS — M7918 Myalgia, other site: Secondary | ICD-10-CM | POA: Diagnosis not present

## 2022-07-28 DIAGNOSIS — M4322 Fusion of spine, cervical region: Secondary | ICD-10-CM | POA: Diagnosis not present

## 2022-07-28 DIAGNOSIS — M5412 Radiculopathy, cervical region: Secondary | ICD-10-CM | POA: Diagnosis not present

## 2022-08-06 ENCOUNTER — Other Ambulatory Visit: Payer: Self-pay | Admitting: Family Medicine

## 2022-08-06 NOTE — Telephone Encounter (Signed)
Sent her a note with her refill telling her she needs an appt before more refills. I have given her 60 days of med so she will have plenety of time to get appt.

## 2022-09-02 ENCOUNTER — Telehealth: Payer: BC Managed Care – PPO | Admitting: Nurse Practitioner

## 2022-09-02 ENCOUNTER — Telehealth: Payer: BC Managed Care – PPO | Admitting: Physician Assistant

## 2022-09-02 DIAGNOSIS — R051 Acute cough: Secondary | ICD-10-CM

## 2022-09-02 DIAGNOSIS — J0140 Acute pansinusitis, unspecified: Secondary | ICD-10-CM | POA: Diagnosis not present

## 2022-09-02 DIAGNOSIS — Z1231 Encounter for screening mammogram for malignant neoplasm of breast: Secondary | ICD-10-CM | POA: Diagnosis not present

## 2022-09-02 MED ORDER — DOXYCYCLINE HYCLATE 100 MG PO TABS: 100.0000 mg | ORAL_TABLET | Freq: Two times a day (BID) | ORAL | 0 refills | Status: AC

## 2022-09-02 MED ORDER — IPRATROPIUM BROMIDE 0.03 % NA SOLN
2.0000 | Freq: Two times a day (BID) | NASAL | 12 refills | Status: DC
Start: 2022-09-02 — End: 2022-10-14

## 2022-09-02 MED ORDER — PROMETHAZINE-DM 6.25-15 MG/5ML PO SYRP
5.0000 mL | ORAL_SOLUTION | Freq: Four times a day (QID) | ORAL | 0 refills | Status: DC | PRN
Start: 2022-09-02 — End: 2022-10-14

## 2022-09-02 NOTE — Progress Notes (Signed)
Patient had already completed an EVisit questionnaire and had Doxycycline and Ipratropium supplied. She was unaware medications had been prescribed. She did request to add a cough medication. Promethazine DM added. Visit no charged due to being a duplicate.

## 2022-09-02 NOTE — Progress Notes (Signed)
E-Visit for Sinus Problems  We are sorry that you are not feeling well.  Here is how we plan to help!  Based on what you have shared with me it looks like you have sinusitis.  Sinusitis is inflammation and infection in the sinus cavities of the head.  Based on your presentation I believe you most likely have Acute Bacterial Sinusitis.  This is an infection caused by bacteria and is treated with antibiotics. I have prescribed Doxycycline 100mg  by mouth twice a day for 10 days. And a nasal spray for added support   Meds ordered this encounter  Medications   doxycycline (VIBRA-TABS) 100 MG tablet    Sig: Take 1 tablet (100 mg total) by mouth 2 (two) times daily for 10 days.    Dispense:  20 tablet    Refill:  0   ipratropium (ATROVENT) 0.03 % nasal spray    Sig: Place 2 sprays into both nostrils every 12 (twelve) hours.    Dispense:  30 mL    Refill:  12      You may use an oral decongestant such as Mucinex D, this will also help with the cough-or if you have glaucoma or high blood pressure use plain Mucinex. Saline nasal spray help and can safely be used as often as needed for congestion.  If you develop worsening sinus pain, fever or notice severe headache and vision changes, or if symptoms are not better after completion of antibiotic, please schedule an appointment with a health care provider.    Sinus infections are not as easily transmitted as other respiratory infection, however we still recommend that you avoid close contact with loved ones, especially the very young and elderly.  Remember to wash your hands thoroughly throughout the day as this is the number one way to prevent the spread of infection!  Home Care: Only take medications as instructed by your medical team. Complete the entire course of an antibiotic. Do not take these medications with alcohol. A steam or ultrasonic humidifier can help congestion.  You can place a towel over your head and breathe in the steam from hot  water coming from a faucet. Avoid close contacts especially the very young and the elderly. Cover your mouth when you cough or sneeze. Always remember to wash your hands.  Get Help Right Away If: You develop worsening fever or sinus pain. You develop a severe head ache or visual changes. Your symptoms persist after you have completed your treatment plan.  Make sure you Understand these instructions. Will watch your condition. Will get help right away if you are not doing well or get worse.  Thank you for choosing an e-visit.  Your e-visit answers were reviewed by a board certified advanced clinical practitioner to complete your personal care plan. Depending upon the condition, your plan could have included both over the counter or prescription medications.  Please review your pharmacy choice. Make sure the pharmacy is open so you can pick up prescription now. If there is a problem, you may contact your provider through [employer] and have the prescription routed to another pharmacy.  Your safety is important to [place]. If you have drug allergies check your prescription carefully.   For the next 24 hours you can use MyChart to ask questions about today's visit, request a non-urgent call back, or ask for a work or school excuse. You will get an email in the next two days asking about your experience. I hope that your e-visit  has been valuable and will speed your recovery.   I spent approximately 5 minutes reviewing the patient's history, current symptoms and coordinating their care today.

## 2022-09-03 ENCOUNTER — Ambulatory Visit: Payer: BC Managed Care – PPO | Admitting: Family Medicine

## 2022-09-04 MED ORDER — FLUCONAZOLE 150 MG PO TABS: 150.0000 mg | ORAL_TABLET | ORAL | 0 refills | Status: DC | PRN

## 2022-09-04 NOTE — Addendum Note (Signed)
Addended by: Margaretann Loveless on: 09/04/2022 07:32 AM   Modules accepted: Orders

## 2022-09-30 ENCOUNTER — Ambulatory Visit: Payer: BC Managed Care – PPO | Admitting: Family Medicine

## 2022-10-09 ENCOUNTER — Other Ambulatory Visit: Payer: Self-pay | Admitting: Family Medicine

## 2022-10-14 ENCOUNTER — Encounter: Payer: Self-pay | Admitting: Family Medicine

## 2022-10-14 ENCOUNTER — Ambulatory Visit (INDEPENDENT_AMBULATORY_CARE_PROVIDER_SITE_OTHER): Payer: BC Managed Care – PPO | Admitting: Family Medicine

## 2022-10-14 VITALS — BP 116/68 | HR 84 | Ht 64.0 in | Wt 149.6 lb

## 2022-10-14 DIAGNOSIS — G479 Sleep disorder, unspecified: Secondary | ICD-10-CM | POA: Diagnosis not present

## 2022-10-14 DIAGNOSIS — R21 Rash and other nonspecific skin eruption: Secondary | ICD-10-CM

## 2022-10-14 DIAGNOSIS — E785 Hyperlipidemia, unspecified: Secondary | ICD-10-CM

## 2022-10-14 DIAGNOSIS — Z13228 Encounter for screening for other metabolic disorders: Secondary | ICD-10-CM

## 2022-10-14 NOTE — Assessment & Plan Note (Signed)
Check lipids 

## 2022-10-14 NOTE — Progress Notes (Signed)
CHIEF COMPLAINT / HPI: Wants to discuss cancer screening.  Her mother was recently diagnosed with PML.  Mom also has ataxia type III.  Brandi Lopez has previously been tested for that using some type of genetic testing and was found not to have the abnormal repeats.  She does not really want further testing for the ataxia.  She is wondering if she needs testing for leukemia or bone marrow disease.  She is asymptomatic other than chronic fatigue and chronic sleep disorder. 2.  Father also recently diagnosed with head and neck cancer.  He was previously a smoker for many years.  They are both being treated in [place] where they live. 3.  Stressors: Recently had younger sibling die from a motor vehicle crash. 4.  Has discontinued some of her medications, trying to follow a healthier lifestyle.  Has also stopped smoking tobacco. 5.  Continues to have sleep disorder at times.  She continues to use [place] daily and most the time it works but every once in a while she will have a couple of days where she is really off schedule totally.  Was unable to keep appointment with sleep specialist. 6.  Reports history of elevated cholesterol.  Previously tried some type of cholesterol medicine which gave her short-term memory loss.  She is amenable to getting it checked again since it has been quite a while. #7.  Rash on the back of her hands.  Previous provider had given her some triamcinolone cream which helps but does not resolve it.  She thinks she was told it was related to some kind of photosensitivity.  Mildly itchy at times.   PERTINENT  PMH / PSH: I have reviewed the patient's medications, allergies, past medical and surgical history, smoking status and updated in the EMR as appropriate.   OBJECTIVE:  BP 116/68   Pulse 84   Ht 5\' 4"  (1.626 m)   Wt 149 lb 9.6 oz (67.9 kg)   LMP 10/09/2022   SpO2 99%   BMI 25.68 kg/m  GENERAL: Well-developed female no acute distress SKIN: Very tiny red papules on the  dorsum of both hands.  Does not extend past the wrist except on the right arm extends a little bit up into the forearm.  Diffuse.  Not excoriated.  Skin is otherwise normal on hands. PSYCH: AxOx4. Good eye contact.. No psychomotor retardation or agitation. Appropriate speech fluency and content. Asks and answers questions appropriately. Mood is congruent. RESPIRATORY: Normal work of breathing  ASSESSMENT / PLAN: #1.  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  She has previously used triamcinolone for that.  Will try slightly higher dose steroid cream and call that in to use for 3 to 4 weeks.  Also go ahead and place a referral to dermatology although it may be quite a while before they can see her.  I see her back in 1 month, likely will discontinue the higher potency steroid cream. 2.  Sleep disorder: Chronic: Continues on Lunesta.  I would like to get her into see a sleep specialist.  She is going be very busy traveling back and forth to [place] with her parents health issues but will keep this in mind for the future. 3.  Mom diagnosed with PML and dad with head and neck cancer.  Discussed lack of good screening for either of these.  Will get CBC and CMP as she has not had a blood work for a while.  Will add lipid  panel as well.  She might be willing to consider different type of cholesterol medicine if her cholesterol remains high.  Hyperlipidemia Check lipids.  Sleep disorder Chronic.  For now we will continue use of Lunesta.  I am glad she is discontinued, did not feel.  She is also starting to exercise more with her partner and I encouraged that.  Follow-up 1 month.   Denny Levy MD

## 2022-10-14 NOTE — Patient Instructions (Signed)
Great to see you!   

## 2022-10-14 NOTE — Assessment & Plan Note (Signed)
Chronic.  For now we will continue use of Lunesta.  I am glad she is discontinued, did not feel.  She is also starting to exercise more with her partner and I encouraged that.  Follow-up 1 month.

## 2022-10-15 ENCOUNTER — Other Ambulatory Visit: Payer: Self-pay | Admitting: Family Medicine

## 2022-10-15 LAB — COMPREHENSIVE METABOLIC PANEL
ALT: 6 IU/L (ref 0–32)
AST: 11 IU/L (ref 0–40)
Albumin: 4.5 g/dL (ref 3.9–4.9)
Alkaline Phosphatase: 44 IU/L (ref 44–121)
BUN/Creatinine Ratio: 22 (ref 9–23)
BUN: 15 mg/dL (ref 6–24)
Bilirubin Total: 0.2 mg/dL (ref 0.0–1.2)
CO2: 21 mmol/L (ref 20–29)
Calcium: 9.8 mg/dL (ref 8.7–10.2)
Chloride: 103 mmol/L (ref 96–106)
Creatinine, Ser: 0.68 mg/dL (ref 0.57–1.00)
Globulin, Total: 2.4 g/dL (ref 1.5–4.5)
Glucose: 91 mg/dL (ref 70–99)
Potassium: 4.3 mmol/L (ref 3.5–5.2)
Sodium: 139 mmol/L (ref 134–144)
Total Protein: 6.9 g/dL (ref 6.0–8.5)
eGFR: 112 mL/min/{1.73_m2} (ref >59)

## 2022-10-15 LAB — CBC WITH DIFFERENTIAL/PLATELET
Basophils Absolute: 0 10*3/uL (ref 0.0–0.2)
Basos: 1 %
EOS (ABSOLUTE): 0.1 10*3/uL (ref 0.0–0.4)
Eos: 1 %
Hematocrit: 40 % (ref 34.0–46.6)
Hemoglobin: 13 g/dL (ref 11.1–15.9)
Immature Grans (Abs): 0 10*3/uL (ref 0.0–0.1)
Immature Granulocytes: 0 %
Lymphocytes Absolute: 2.5 10*3/uL (ref 0.7–3.1)
Lymphs: 39 %
MCH: 29.1 pg (ref 26.6–33.0)
MCHC: 32.5 g/dL (ref 31.5–35.7)
MCV: 90 fL (ref 79–97)
Monocytes Absolute: 0.5 10*3/uL (ref 0.1–0.9)
Monocytes: 8 %
Neutrophils Absolute: 3.4 10*3/uL (ref 1.4–7.0)
Neutrophils: 51 %
Platelets: 307 10*3/uL (ref 150–450)
RBC: 4.46 x10E6/uL (ref 3.77–5.28)
RDW: 12.7 % (ref 11.7–15.4)
WBC: 6.5 10*3/uL (ref 3.4–10.8)

## 2022-10-15 LAB — LIPID PANEL
Chol/HDL Ratio: 4.8 ratio — ABNORMAL HIGH (ref 0.0–4.4)
Cholesterol, Total: 274 mg/dL — ABNORMAL HIGH (ref 100–199)
HDL: 57 mg/dL (ref >39)
LDL Chol Calc (NIH): 198 mg/dL — ABNORMAL HIGH (ref 0–99)
Triglycerides: 107 mg/dL (ref 0–149)
VLDL Cholesterol Cal: 19 mg/dL (ref 5–40)

## 2022-10-15 MED ORDER — TRIAMCINOLONE ACETONIDE 0.1 % EX CREA: 1.0000 | TOPICAL_CREAM | Freq: Two times a day (BID) | CUTANEOUS | 1 refills | Status: AC

## 2022-10-15 NOTE — Addendum Note (Signed)
Addended byDenny Levy L on: 10/15/2022 11:26 AM   Modules accepted: Orders

## 2022-10-16 ENCOUNTER — Encounter: Payer: Self-pay | Admitting: Family Medicine

## 2022-10-24 ENCOUNTER — Encounter: Payer: Self-pay | Admitting: Family Medicine

## 2022-10-26 MED ORDER — TRAMADOL HCL 50 MG PO TABS: 50.0000 mg | ORAL_TABLET | Freq: Two times a day (BID) | ORAL | 3 refills | Status: DC | PRN

## 2022-11-16 ENCOUNTER — Other Ambulatory Visit: Payer: Self-pay | Admitting: Student

## 2022-11-20 ENCOUNTER — Other Ambulatory Visit (HOSPITAL_COMMUNITY): Payer: Self-pay

## 2022-12-01 ENCOUNTER — Encounter: Payer: Self-pay | Admitting: Family Medicine

## 2022-12-02 ENCOUNTER — Ambulatory Visit (INDEPENDENT_AMBULATORY_CARE_PROVIDER_SITE_OTHER): Payer: BC Managed Care – PPO

## 2022-12-02 ENCOUNTER — Telehealth: Payer: BC Managed Care – PPO | Admitting: Physician Assistant

## 2022-12-02 DIAGNOSIS — J069 Acute upper respiratory infection, unspecified: Secondary | ICD-10-CM | POA: Diagnosis not present

## 2022-12-02 DIAGNOSIS — Z111 Encounter for screening for respiratory tuberculosis: Secondary | ICD-10-CM | POA: Diagnosis not present

## 2022-12-02 NOTE — Progress Notes (Signed)
Patient is here for a PPD placement.  PPD placed in left forearm @ 9:13 am.  Patient will return 12/04/2022 to have PPD read. Veronda Prude, RN

## 2022-12-03 MED ORDER — BENZONATATE 100 MG PO CAPS
100.0000 mg | ORAL_CAPSULE | Freq: Three times a day (TID) | ORAL | 0 refills | Status: DC | PRN
Start: 2022-12-03 — End: 2023-05-05

## 2022-12-03 MED ORDER — FLUTICASONE PROPIONATE 50 MCG/ACT NA SUSP
2.0000 | Freq: Every day | NASAL | 0 refills | Status: DC
Start: 2022-12-03 — End: 2023-05-05

## 2022-12-03 NOTE — Progress Notes (Signed)

## 2022-12-03 NOTE — Progress Notes (Signed)
I have spent 5 minutes in review of e-visit questionnaire, review and updating patient chart, medical decision making and response to patient.   William Cody Martin, PA-C    

## 2022-12-04 ENCOUNTER — Ambulatory Visit: Payer: BC Managed Care – PPO

## 2022-12-04 DIAGNOSIS — M7918 Myalgia, other site: Secondary | ICD-10-CM | POA: Diagnosis not present

## 2022-12-04 DIAGNOSIS — M4722 Other spondylosis with radiculopathy, cervical region: Secondary | ICD-10-CM | POA: Diagnosis not present

## 2022-12-04 DIAGNOSIS — M5412 Radiculopathy, cervical region: Secondary | ICD-10-CM | POA: Diagnosis not present

## 2022-12-04 DIAGNOSIS — Z6831 Body mass index (BMI) 31.0-31.9, adult: Secondary | ICD-10-CM | POA: Diagnosis not present

## 2022-12-04 DIAGNOSIS — Z111 Encounter for screening for respiratory tuberculosis: Secondary | ICD-10-CM

## 2022-12-04 LAB — TB SKIN TEST
Induration: 0 mm
TB Skin Test: NEGATIVE

## 2022-12-04 NOTE — Progress Notes (Signed)
Patient is here for a PPD read.  It was placed on 12/02/2022 in the left forearm @ 9:13 am.    PPD RESULTS:  Result: negative Induration: 0 mm  Letter created and given to patient for documentation purposes. Veronda Prude, RN

## 2023-05-02 ENCOUNTER — Other Ambulatory Visit: Payer: Self-pay | Admitting: Family Medicine

## 2023-05-05 ENCOUNTER — Ambulatory Visit: Payer: Medicaid Other | Admitting: Family Medicine

## 2023-05-05 ENCOUNTER — Encounter: Payer: Self-pay | Admitting: Family Medicine

## 2023-05-05 VITALS — BP 112/76 | HR 86 | Ht 64.0 in | Wt 169.8 lb

## 2023-05-05 DIAGNOSIS — M546 Pain in thoracic spine: Secondary | ICD-10-CM

## 2023-05-05 MED ORDER — BUSPIRONE HCL 10 MG PO TABS: 10.0000 mg | ORAL_TABLET | Freq: Every evening | ORAL | 5 refills | Status: DC | PRN

## 2023-05-05 MED ORDER — ESZOPICLONE 3 MG PO TABS: 3.0000 mg | ORAL_TABLET | Freq: Every day | ORAL | 5 refills | Status: DC

## 2023-05-05 MED ORDER — TRAMADOL HCL 50 MG PO TABS: 50.0000 mg | ORAL_TABLET | Freq: Two times a day (BID) | ORAL | 3 refills | Status: AC | PRN

## 2023-05-05 NOTE — Progress Notes (Signed)
    CHIEF COMPLAINT / HPI: Follow-up chronic issues specifically sleep.  Still using current medication with success.  She has stopped her gabapentin or at least is tapering it down because she does not want to be on that any more if she can help but she thinks her chronic pain may be improved.  Having a lot of stressors in the family.   PERTINENT  PMH / PSH: I have reviewed the patient's medications, allergies, past medical and surgical history, smoking status and updated in the EMR as appropriate.   OBJECTIVE:  BP 112/76   Pulse 86   Ht 5\' 4"  (1.626 m)   Wt 169 lb 12.8 oz (77 kg)   SpO2 100%   BMI 29.15 kg/m   Vital signs reviewed. GENERAL: Well-developed, well-nourished, no acute distress. CARDIOVASCULAR: Regular rate and rhythm no murmur gallop or rub LUNGS: Clear to auscultation bilaterally, no rales or wheeze. ABDOMEN: Soft positive bowel sounds NEURO: No gross focal neurological deficits. MSK: Movement of extremity x 4. PSYCH: AxOx4. Good eye contact.. No psychomotor retardation or agitation. Appropriate speech fluency and content. Asks and answers questions appropriately. Mood is congruent.  ASSESSMENT / PLAN: Insomnia: She has done well on Lunesta and we will continue that for now. 2.  Stressors at home: We discussed at some length. 3.  Preventative health: She will schedule for Pap smear.  No problem-specific Assessment & Plan notes found for this encounter.   Denny Levy MD

## 2023-05-17 ENCOUNTER — Other Ambulatory Visit (HOSPITAL_COMMUNITY): Payer: Self-pay

## 2023-05-27 ENCOUNTER — Ambulatory Visit: Payer: Medicaid Other

## 2023-06-02 ENCOUNTER — Other Ambulatory Visit (HOSPITAL_COMMUNITY): Payer: Self-pay

## 2023-06-04 ENCOUNTER — Other Ambulatory Visit (HOSPITAL_COMMUNITY): Payer: Self-pay

## 2023-06-04 ENCOUNTER — Telehealth: Payer: Self-pay

## 2023-06-04 NOTE — Telephone Encounter (Signed)
 Pharmacy Patient Advocate Encounter   Received notification from CoverMyMeds that prior authorization for Eszopiclone  is required/requested.   Insurance verification completed.   The patient is insured through [facility] .   Per test claim: PA required; PA submitted to above mentioned insurance via CoverMyMeds Key/confirmation #/EOC [employer]. Status is pending
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
## 2023-06-07 ENCOUNTER — Ambulatory Visit: Payer: Medicaid Other | Attending: Family Medicine | Admitting: Physical Therapy

## 2023-06-07 ENCOUNTER — Other Ambulatory Visit: Payer: Self-pay

## 2023-06-07 ENCOUNTER — Encounter: Payer: Self-pay | Admitting: Physical Therapy

## 2023-06-07 DIAGNOSIS — M546 Pain in thoracic spine: Secondary | ICD-10-CM | POA: Insufficient documentation

## 2023-06-07 DIAGNOSIS — M542 Cervicalgia: Secondary | ICD-10-CM | POA: Diagnosis not present

## 2023-06-07 DIAGNOSIS — R293 Abnormal posture: Secondary | ICD-10-CM | POA: Diagnosis not present

## 2023-06-07 DIAGNOSIS — M5459 Other low back pain: Secondary | ICD-10-CM | POA: Diagnosis not present

## 2023-06-07 NOTE — Telephone Encounter (Signed)
 Pharmacy Patient Advocate Encounter  Received notification from [facility] that Prior Authorization for ESZOPICLONE 3MG  has been APPROVED from 06/04/23 to 12/01/23   PA #/Case ID/Reference #: [id number]

## 2023-06-14 ENCOUNTER — Encounter: Payer: Self-pay | Admitting: Physical Therapy

## 2023-06-14 ENCOUNTER — Ambulatory Visit: Admitting: Physical Therapy

## 2023-06-14 DIAGNOSIS — M542 Cervicalgia: Secondary | ICD-10-CM | POA: Diagnosis not present

## 2023-06-14 DIAGNOSIS — M5459 Other low back pain: Secondary | ICD-10-CM | POA: Diagnosis not present

## 2023-06-14 DIAGNOSIS — R293 Abnormal posture: Secondary | ICD-10-CM | POA: Diagnosis not present

## 2023-06-14 DIAGNOSIS — M546 Pain in thoracic spine: Secondary | ICD-10-CM | POA: Diagnosis not present
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
## 2023-06-16 ENCOUNTER — Ambulatory Visit: Admitting: Physical Therapy

## 2023-06-18 ENCOUNTER — Encounter: Payer: Self-pay | Admitting: Family Medicine

## 2023-06-18 ENCOUNTER — Ambulatory Visit: Admitting: Family Medicine

## 2023-06-18 VITALS — BP 110/80 | HR 97 | Ht 64.0 in | Wt 169.0 lb

## 2023-06-18 DIAGNOSIS — S8992XA Unspecified injury of left lower leg, initial encounter: Secondary | ICD-10-CM | POA: Diagnosis not present

## 2023-06-18 DIAGNOSIS — Z23 Encounter for immunization: Secondary | ICD-10-CM

## 2023-06-18 DIAGNOSIS — S8990XA Unspecified injury of unspecified lower leg, initial encounter: Secondary | ICD-10-CM | POA: Insufficient documentation

## 2023-06-18 MED ORDER — NAPROXEN 500 MG PO TABS: 500.0000 mg | ORAL_TABLET | Freq: Two times a day (BID) | ORAL | 0 refills | Status: DC | PRN

## 2023-06-18 NOTE — Assessment & Plan Note (Signed)
 No concern for LL fracture, or knee dislocation As discussed with her, xray is not needed and she agreed with this. Due to mild abrasion from metal injury, Tdap was given today Naproxen prescribed prn pain F/U soon if symptoms worsens
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
## 2023-06-18 NOTE — Patient Instructions (Signed)
 Shin Splints Rehab Ask your health care provider which exercises are safe for you. Do exercises exactly as told by your health care provider and adjust them as directed. It is normal to feel mild stretching, pulling, tightness, or discomfort as you do these exercises. Stop right away if you feel sudden pain or your pain gets worse. Do not begin these exercises until told by your health care provider. Stretching and range-of-motion exercise This exercise warms up your muscles and joints and improves the movement and flexibility of your lower leg. This exercise also helps to relieve pain. Calf stretch, standing  Stand with the ball of your left / right foot on a step. The ball of your foot is on the walking surface, right under your toes. Keep your other foot firmly on the same step. Hold on to the wall, a railing, or a chair for balance. Slowly lift your other foot, allowing your body weight to press your left / right heel down over the edge of the step. You should feel a stretch in your left / right calf. Hold this position for __________ seconds. Repeat this exercise with a slight bend in your left / right knee. Repeat __________ times with your left / right knee straight and __________ times with your left / right knee bent. Complete this exercise __________ times a day. Strengthening exercises These exercises build strength and endurance in your lower leg. Endurance is the ability to use your muscles for a long time, even after they get tired. Dorsiflexion with band  Secure a rubber exercise band or tubing to a fixed object, such as a table leg or a pole. Secure the other end of the band around your left / right foot. Sit on the floor, facing the fixed object with your left / right leg extended. The band should be slightly tense when your foot is relaxed. Slowly use your ankle muscles to pull your foot toward you (dorsiflexion). Hold this position for __________ seconds. Slowly release the  tension in the band and return your foot to the starting position. Repeat __________ times. Complete this exercise __________ times a day. Ankle eversion with band  Secure one end of a rubber exercise band or tubing to a fixed object, such as a table leg or a pole, that will stay in place when the band is pulled. Loop the other end of the band around the middle of your left / right foot. Sit on the floor, facing the fixed object. The band should be slightly tense when your foot is relaxed. Make fists with your hands and put them between your knees. This will focus your strengthening at your ankle. Leading with your little toe, slowly push your banded foot outward, away from your other leg (eversion). Make sure the band is positioned to resist the entire motion. Hold this position for __________ seconds. Control the tension in the band as you slowly return your foot to the starting position. Repeat __________ times. Complete this exercise __________ times a day. Ankle inversion with band  Secure one end of a rubber exercise band or tubing to a fixed object, such as a table leg or a pole, that will stay in place when the band is pulled. Loop the other end of the band around your left / right foot, just below your toes. Sit on the floor, facing the fixed object. The band should be slightly tense when your foot is relaxed. Make fists with your hands and put them between your  knees. This will focus your strengthening at your ankle. Leading with your big toe, slowly pull your banded foot inward, toward your other leg (inversion). Make sure the band is positioned to resist the entire motion. Hold this position for __________ seconds. Control the tension in the band as you slowly return your foot to the starting position. Repeat __________ times. Complete this exercise __________ times a day. Lateral walking with band This is an exercise in which you walk sideways (lateral) with tension provided by an  exercise band. Stand in a long hallway. Wrap a loop of exercise band around your legs, just above your knees. Bend your knees gently and drop your hips down and back so your weight is over your heels. Step to the side to move down the length of the hallway, keeping your toes pointed forward and keeping tension in the band. Repeat, leading with your other leg. Repeat __________ times. Complete this exercise __________ times a day. Balance exercise This exercise will help improve your control of your foot and ankle when you are standing or walking. Single leg stance  Without wearing shoes, stand near a railing or in a doorway. You may hold on to the railing or door frame as needed. Stand on your left / right foot. Keep your big toe down on the floor and try to keep your arch lifted. If this exercise is too easy, you can try doing it with your eyes closed or while standing on a pillow. Hold this position for __________ seconds. Repeat __________ times. Complete this exercise __________ times a day. This information is not intended to replace advice given to you by your health care provider. Make sure you discuss any questions you have with your health care provider. Document Revised: 06/20/2021 Document Reviewed: 06/20/2021 Elsevier Patient Education  2024 [employer].

## 2023-06-21 ENCOUNTER — Encounter: Admitting: Physical Therapy

## 2023-06-23 ENCOUNTER — Ambulatory Visit: Admitting: Physical Therapy

## 2023-06-28 ENCOUNTER — Encounter: Admitting: Physical Therapy

## 2023-06-30 ENCOUNTER — Ambulatory Visit: Admitting: Physical Therapy

## 2023-07-05 ENCOUNTER — Ambulatory Visit: Admitting: Physical Therapy

## 2023-07-07 ENCOUNTER — Ambulatory Visit: Admitting: Physical Therapy

## 2023-07-17 ENCOUNTER — Telehealth: Admitting: Family Medicine

## 2023-07-17 DIAGNOSIS — J069 Acute upper respiratory infection, unspecified: Secondary | ICD-10-CM

## 2023-07-17 MED ORDER — BENZONATATE 200 MG PO CAPS: 200.0000 mg | ORAL_CAPSULE | Freq: Two times a day (BID) | ORAL | 0 refills | Status: AC | PRN

## 2023-07-17 MED ORDER — FLUTICASONE PROPIONATE 50 MCG/ACT NA SUSP: 2.0000 | Freq: Every day | NASAL | 6 refills | Status: AC

## 2023-07-17 MED ORDER — PREDNISONE 20 MG PO TABS: 20.0000 mg | ORAL_TABLET | Freq: Two times a day (BID) | ORAL | 0 refills | Status: AC

## 2023-07-17 NOTE — Progress Notes (Signed)

## 2023-07-20 ENCOUNTER — Telehealth: Admitting: Physician Assistant

## 2023-07-20 DIAGNOSIS — B9689 Other specified bacterial agents as the cause of diseases classified elsewhere: Secondary | ICD-10-CM | POA: Diagnosis not present

## 2023-07-20 DIAGNOSIS — J019 Acute sinusitis, unspecified: Secondary | ICD-10-CM | POA: Diagnosis not present

## 2023-07-20 MED ORDER — PROMETHAZINE-DM 6.25-15 MG/5ML PO SYRP: 5.0000 mL | ORAL_SOLUTION | Freq: Four times a day (QID) | ORAL | 0 refills | Status: AC | PRN

## 2023-07-20 MED ORDER — ONDANSETRON 4 MG PO TBDP: 4.0000 mg | ORAL_TABLET | Freq: Three times a day (TID) | ORAL | 0 refills | Status: AC | PRN

## 2023-07-20 MED ORDER — AMOXICILLIN-POT CLAVULANATE 875-125 MG PO TABS: 1.0000 | ORAL_TABLET | Freq: Two times a day (BID) | ORAL | 0 refills | Status: AC

## 2023-07-20 NOTE — Patient Instructions (Signed)
 [facility], thank you for joining Hyla Maillard, PA-C for today's virtual visit.  While this provider is not your primary care provider (PCP), if your PCP is located in our provider database this encounter information will be shared with them immediately following your visit.   A [facility] MyChart account gives you access to today's visit and all your visits, tests, and labs performed at [facility] " click here if you don't have a [facility] MyChart account or go to [email or web address]  Consent: (Patient) [facility] provided verbal consent for this virtual visit at the beginning of the encounter.  Current Medications:  Current Outpatient Medications:    benzonatate (TESSALON) 200 MG capsule, Take 1 capsule (200 mg total) by mouth 2 (two) times daily as needed for cough., Disp: 20 capsule, Rfl: 0   busPIRone (BUSPAR) 10 MG tablet, Take 1 tablet (10 mg total) by mouth at bedtime as needed., Disp: 30 tablet, Rfl: 5   Eszopiclone 3 MG TABS, Take 1 tablet (3 mg total) by mouth at bedtime. Take immediately before bedtime, Disp: 30 tablet, Rfl: 5   fluticasone (FLONASE) 50 MCG/ACT nasal spray, Place 2 sprays into both nostrils daily., Disp: 16 g, Rfl: 6   levocetirizine (XYZAL) 5 MG tablet, TAKE 1 TABLET(5 MG) BY MOUTH EVERY EVENING, Disp: 30 tablet, Rfl: 0   naproxen (NAPROSYN) 500 MG tablet, Take 1 tablet (500 mg total) by mouth 2 (two) times daily between meals as needed for moderate pain (pain score 4-6)., Disp: 30 tablet, Rfl: 0   predniSONE (DELTASONE) 20 MG tablet, Take 1 tablet (20 mg total) by mouth 2 (two) times daily with a meal for 5 days., Disp: 10 tablet, Rfl: 0   Pregabalin (LYRICA PO), Take by mouth. (Patient not taking: Reported on 06/18/2023), Disp: , Rfl:    traMADol (ULTRAM) 50 MG tablet, Take 1 tablet (50 mg total) by mouth every 12 (twelve) hours as needed. (Patient not taking: Reported on 06/18/2023), Disp: 60 tablet, Rfl: 3   triamcinolone cream  (KENALOG) 0.1 %, Apply 1 Application topically 2 (two) times daily. (Patient not taking: Reported on 06/18/2023), Disp: 30 g, Rfl: 1   Medications ordered in this encounter:  No orders of the defined types were placed in this encounter.    *If you need refills on other medications prior to your next appointment, please contact your pharmacy*  Follow-Up: Call back or seek an in-person evaluation if the symptoms worsen or if the condition fails to improve as anticipated.  [facility] [phone]  Other Instructions Please take antibiotic as directed.  Increase fluid intake.  Use Saline nasal spray.  Take a daily multivitamin. Take all prescribed medications as directed.  Place a humidifier in the bedroom.  Please call or return clinic if symptoms are not improving.  Sinusitis Sinusitis is redness, soreness, and swelling (inflammation) of the paranasal sinuses. Paranasal sinuses are air pockets within the bones of your face (beneath the eyes, the middle of the forehead, or above the eyes). In healthy paranasal sinuses, mucus is able to drain out, and air is able to circulate through them by way of your nose. However, when your paranasal sinuses are inflamed, mucus and air can become trapped. This can allow bacteria and other germs to grow and cause infection. Sinusitis can develop quickly and last only a short time (acute) or continue over a long period (chronic). Sinusitis that lasts for more than 12 weeks is considered chronic.  CAUSES  Causes  of sinusitis include: Allergies. Structural abnormalities, such as displacement of the cartilage that separates your nostrils (deviated septum), which can decrease the air flow through your nose and sinuses and affect sinus drainage. Functional abnormalities, such as when the small hairs (cilia) that line your sinuses and help remove mucus do not work properly or are not present. SYMPTOMS  Symptoms of acute and chronic sinusitis are the  same. The primary symptoms are pain and pressure around the affected sinuses. Other symptoms include: Upper toothache. Earache. Headache. Bad breath. Decreased sense of smell and taste. A cough, which worsens when you are lying flat. Fatigue. Fever. Thick drainage from your nose, which often is green and may contain pus (purulent). Swelling and warmth over the affected sinuses. DIAGNOSIS  Your caregiver will perform a physical exam. During the exam, your caregiver may: Look in your nose for signs of abnormal growths in your nostrils (nasal polyps). Tap over the affected sinus to check for signs of infection. View the inside of your sinuses (endoscopy) with a special imaging device with a light attached (endoscope), which is inserted into your sinuses. If your caregiver suspects that you have chronic sinusitis, one or more of the following tests may be recommended: Allergy tests. Nasal culture A sample of mucus is taken from your nose and sent to a lab and screened for bacteria. Nasal cytology A sample of mucus is taken from your nose and examined by your caregiver to determine if your sinusitis is related to an allergy. TREATMENT  Most cases of acute sinusitis are related to a viral infection and will resolve on their own within 10 days. Sometimes medicines are prescribed to help relieve symptoms (pain medicine, decongestants, nasal steroid sprays, or saline sprays).  However, for sinusitis related to a bacterial infection, your caregiver will prescribe antibiotic medicines. These are medicines that will help kill the bacteria causing the infection.  Rarely, sinusitis is caused by a fungal infection. In theses cases, your caregiver will prescribe antifungal medicine. For some cases of chronic sinusitis, surgery is needed. Generally, these are cases in which sinusitis recurs more than 3 times per year, despite other treatments. HOME CARE INSTRUCTIONS  Drink plenty of water. Water helps thin  the mucus so your sinuses can drain more easily. Use a humidifier. Inhale steam 3 to 4 times a day (for example, sit in the bathroom with the shower running). Apply a warm, moist washcloth to your face 3 to 4 times a day, or as directed by your caregiver. Use saline nasal sprays to help moisten and clean your sinuses. Take over-the-counter or prescription medicines for pain, discomfort, or fever only as directed by your caregiver. SEEK IMMEDIATE MEDICAL CARE IF: You have increasing pain or severe headaches. You have nausea, vomiting, or drowsiness. You have swelling around your face. You have vision problems. You have a stiff neck. You have difficulty breathing. MAKE SURE YOU:  Understand these instructions. Will watch your condition. Will get help right away if you are not doing well or get worse. Document Released: 03/23/2005 Document Revised: 06/15/2011 Document Reviewed: 04/07/2011 [facility] Patient Information 2014 [place], [place].    If you have been instructed to have an in-person evaluation today at a local Urgent Care facility, please use the link below. It will take you to a list of all of our available [facility], including address, phone number and hours of operation. Please do not delay care.  [facility]  If you or a family member  do not have a primary care provider, use the link below to schedule a visit and establish care. When you choose a [facility] primary care physician or advanced practice provider, you gain a long-term partner in health. Find a Primary Care Provider  Learn more about [facility] in-office and virtual care options: [facility] - Get Care Now

## 2023-07-20 NOTE — Progress Notes (Signed)
 Virtual Visit Consent   [facility], you are scheduled for a virtual visit with a [facility] provider today. Just as with appointments in the office, your consent must be obtained to participate. Your consent will be active for this visit and any virtual visit you may have with one of our providers in the next 365 days. If you have a MyChart account, a copy of this consent can be sent to you electronically.  As this is a virtual visit, video technology does not allow for your provider to perform a traditional examination. This may limit your provider's ability to fully assess your condition. If your provider identifies any concerns that need to be evaluated in person or the need to arrange testing (such as labs, EKG, etc.), we will make arrangements to do so. Although advances in technology are sophisticated, we cannot ensure that it will always work on either your end or our end. If the connection with a video visit is poor, the visit may have to be switched to a telephone visit. With either a video or telephone visit, we are not always able to ensure that we have a secure connection.  By engaging in this virtual visit, you consent to the provision of healthcare and authorize for your insurance to be billed (if applicable) for the services provided during this visit. Depending on your insurance coverage, you may receive a charge related to this service.  I need to obtain your verbal consent now. Are you willing to proceed with your visit today? Brandi Lopez has provided verbal consent on 07/20/2023 for a virtual visit (video or telephone). Piedad Climes, [place]  Date: 07/20/2023 11:48 AM   Virtual Visit via Video Note   I, Piedad Climes, connected with  [place]  ([id number], Dec 26, 1980) on 07/20/23 at 11:45 AM EDT by a video-enabled telemedicine application and verified that I am speaking with the correct person using two identifiers.  Location: Patient: Virtual Visit Location  Patient: Home Provider: Virtual Visit Location Provider: Home Office   I discussed the limitations of evaluation and management by telemedicine and the availability of in person appointments. The patient expressed understanding and agreed to proceed.    History of Present Illness: Brandi Lopez is a 43 y.o. who identifies as a female who was assigned female at birth, and is being seen today for progressing URI symptoms. Patient endorses 6 days of nasal congestion, chest congestion and cough with substantial nasal drip. Was evaluated via e-visit on 4/12 at which time she was diagnosed as having a viral URI. Was started on supportive measures, prednisone and tessalon which she endorses taking as directed. Notes nasal and sinus congestion has progressed, now with thick discharge making her nauseated and an episode of emesis from drainage. Still with persistent cough. Denies fever or chest pain. Is now noting sinus pain as well.  HPI: HPI  Problems:  Patient Active Problem List   Diagnosis Date Noted   Leg injury 06/18/2023   Rash 10/14/2022   Hyperlipidemia 10/14/2022   Insomnia 02/19/2022   Cervical spondylosis 02/19/2022   Sleep disorder 02/19/2022    Allergies: No Known Allergies Medications:  Current Outpatient Medications:    amoxicillin-clavulanate (AUGMENTIN) 875-125 MG tablet, Take 1 tablet by mouth 2 (two) times daily., Disp: 14 tablet, Rfl: 0   ondansetron (ZOFRAN-ODT) 4 MG disintegrating tablet, Take 1 tablet (4 mg total) by mouth every 8 (eight) hours as needed for nausea or vomiting., Disp: 20 tablet, Rfl: 0   promethazine-dextromethorphan (  PROMETHAZINE-DM) 6.25-15 MG/5ML syrup, Take 5 mLs by mouth 4 (four) times daily as needed for cough., Disp: 118 mL, Rfl: 0   benzonatate (TESSALON) 200 MG capsule, Take 1 capsule (200 mg total) by mouth 2 (two) times daily as needed for cough., Disp: 20 capsule, Rfl: 0   busPIRone (BUSPAR) 10 MG tablet, Take 1 tablet (10 mg total) by mouth at  bedtime as needed., Disp: 30 tablet, Rfl: 5   Eszopiclone 3 MG TABS, Take 1 tablet (3 mg total) by mouth at bedtime. Take immediately before bedtime, Disp: 30 tablet, Rfl: 5   fluticasone (FLONASE) 50 MCG/ACT nasal spray, Place 2 sprays into both nostrils daily., Disp: 16 g, Rfl: 6   levocetirizine (XYZAL) 5 MG tablet, TAKE 1 TABLET(5 MG) BY MOUTH EVERY EVENING, Disp: 30 tablet, Rfl: 0   naproxen (NAPROSYN) 500 MG tablet, Take 1 tablet (500 mg total) by mouth 2 (two) times daily between meals as needed for moderate pain (pain score 4-6)., Disp: 30 tablet, Rfl: 0   predniSONE (DELTASONE) 20 MG tablet, Take 1 tablet (20 mg total) by mouth 2 (two) times daily with a meal for 5 days., Disp: 10 tablet, Rfl: 0   Pregabalin (LYRICA PO), Take by mouth. (Patient not taking: Reported on 06/18/2023), Disp: , Rfl:    traMADol (ULTRAM) 50 MG tablet, Take 1 tablet (50 mg total) by mouth every 12 (twelve) hours as needed. (Patient not taking: Reported on 06/18/2023), Disp: 60 tablet, Rfl: 3   triamcinolone cream (KENALOG) 0.1 %, Apply 1 Application topically 2 (two) times daily. (Patient not taking: Reported on 06/18/2023), Disp: 30 g, Rfl: 1  Observations/Objective: Patient is well-developed, well-nourished in no acute distress.  Resting comfortably at home.  Head is normocephalic, atraumatic.  No labored breathing. Speech is clear and coherent with logical content.  Patient is alert and oriented at baseline.   Assessment and Plan: 1. Acute bacterial sinusitis (Primary) - ondansetron (ZOFRAN-ODT) 4 MG disintegrating tablet; Take 1 tablet (4 mg total) by mouth every 8 (eight) hours as needed for nausea or vomiting.  Dispense: 20 tablet; Refill: 0 - amoxicillin-clavulanate (AUGMENTIN) 875-125 MG tablet; Take 1 tablet by mouth 2 (two) times daily.  Dispense: 14 tablet; Refill: 0 - promethazine-dextromethorphan (PROMETHAZINE-DM) 6.25-15 MG/5ML syrup; Take 5 mLs by mouth 4 (four) times daily as needed for cough.   Dispense: 118 mL; Refill: 0  Rx Augmentin.  Increase fluids.  Rest.  Saline nasal spray.  Probiotic.  Mucinex as directed.  Humidifier in bedroom. Promethazine-DM and Zofran per orders. Continue previously prescribed cough medications. Call or return to clinic if symptoms are not improving.   Follow Up Instructions: I discussed the assessment and treatment plan with the patient. The patient was provided an opportunity to ask questions and all were answered. The patient agreed with the plan and demonstrated an understanding of the instructions.  A copy of instructions were sent to the patient via MyChart unless otherwise noted below.   The patient was advised to call back or seek an in-person evaluation if the symptoms worsen or if the condition fails to improve as anticipated.    Hyla Maillard, PA-C

## 2023-08-18 ENCOUNTER — Encounter: Payer: Self-pay | Admitting: Family Medicine

## 2023-08-27 ENCOUNTER — Other Ambulatory Visit: Payer: Self-pay | Admitting: Family Medicine

## 2023-08-27 DIAGNOSIS — K582 Mixed irritable bowel syndrome: Secondary | ICD-10-CM

## 2023-10-19 ENCOUNTER — Other Ambulatory Visit: Payer: Self-pay | Admitting: Family Medicine

## 2023-10-25 ENCOUNTER — Other Ambulatory Visit: Payer: Self-pay

## 2023-10-25 MED ORDER — BUSPIRONE HCL 10 MG PO TABS: 10.0000 mg | ORAL_TABLET | Freq: Every evening | ORAL | 5 refills | Status: AC | PRN

## 2023-11-04 ENCOUNTER — Other Ambulatory Visit: Payer: Self-pay | Admitting: Family Medicine

## 2023-11-05 ENCOUNTER — Other Ambulatory Visit: Payer: Self-pay

## 2023-11-08 MED ORDER — ESZOPICLONE 3 MG PO TABS: 3.0000 mg | ORAL_TABLET | Freq: Every day | ORAL | 5 refills | Status: AC

## 2023-11-28 ENCOUNTER — Other Ambulatory Visit: Payer: Self-pay | Admitting: Family Medicine

## 2023-12-13 ENCOUNTER — Other Ambulatory Visit: Payer: Self-pay

## 2023-12-13 ENCOUNTER — Encounter: Payer: Self-pay | Admitting: Family Medicine

## 2023-12-28 ENCOUNTER — Other Ambulatory Visit: Payer: Self-pay

## 2023-12-30 MED ORDER — NAPROXEN 500 MG PO TABS: 500.0000 mg | ORAL_TABLET | Freq: Two times a day (BID) | ORAL | 0 refills | Status: DC

## 2023-12-31 ENCOUNTER — Other Ambulatory Visit: Payer: Self-pay | Admitting: Medical Genetics

## 2024-01-17 ENCOUNTER — Other Ambulatory Visit: Payer: Self-pay

## 2024-01-17 MED ORDER — NAPROXEN 500 MG PO TABS: 500.0000 mg | ORAL_TABLET | Freq: Two times a day (BID) | ORAL | 2 refills | Status: AC

## 2024-02-03 ENCOUNTER — Telehealth: Payer: Self-pay

## 2024-02-03 NOTE — Telephone Encounter (Signed)
 Received fax from [facility] [address] for refills on Triamcinolone  0.1% cream. Pt reported on 06/18/23: not taking.  Attempted to call patient and left Generic VM to call office back.  If patient calls back can please clarify if taking medication (from above), that way can send to Dr. Rosalynn refills if needed.  Harlene Reiter, CMA

## 2024-04-03 ENCOUNTER — Other Ambulatory Visit: Payer: Self-pay | Admitting: Medical Genetics

## 2024-04-03 DIAGNOSIS — Z006 Encounter for examination for normal comparison and control in clinical research program: Secondary | ICD-10-CM

## 2024-05-13 ENCOUNTER — Telehealth
